# Patient Record
Sex: Female | Born: 1975 | Race: Black or African American | Hispanic: No | Marital: Married | Smoking: Never smoker
Health system: Southern US, Community
[De-identification: ages and names within clinical notes are randomized; demographics above are authoritative.]

## PROBLEM LIST (undated history)

## (undated) DIAGNOSIS — B009 Herpesviral infection, unspecified: Secondary | ICD-10-CM

## (undated) DIAGNOSIS — E7800 Pure hypercholesterolemia, unspecified: Secondary | ICD-10-CM

## (undated) DIAGNOSIS — D649 Anemia, unspecified: Secondary | ICD-10-CM

## (undated) DIAGNOSIS — I10 Essential (primary) hypertension: Secondary | ICD-10-CM

## (undated) DIAGNOSIS — J45909 Unspecified asthma, uncomplicated: Secondary | ICD-10-CM

## (undated) DIAGNOSIS — O039 Complete or unspecified spontaneous abortion without complication: Secondary | ICD-10-CM

## (undated) HISTORY — DX: Anemia, unspecified: D64.9

## (undated) HISTORY — DX: Essential (primary) hypertension: I10

## (undated) HISTORY — DX: Unspecified asthma, uncomplicated: J45.909

## (undated) HISTORY — DX: Herpesviral infection, unspecified: B00.9

## (undated) HISTORY — DX: Pure hypercholesterolemia, unspecified: E78.00

## (undated) HISTORY — DX: Complete or unspecified spontaneous abortion without complication: O03.9

## (undated) HISTORY — PX: ADENOIDECTOMY: SUR15

---

## 1999-01-28 HISTORY — PX: TONSILLECTOMY: SUR1361

## 2004-10-18 ENCOUNTER — Other Ambulatory Visit: Admission: RE | Admit: 2004-10-18 | Discharge: 2004-10-18 | Payer: Self-pay | Admitting: Obstetrics and Gynecology

## 2005-10-27 ENCOUNTER — Inpatient Hospital Stay (HOSPITAL_COMMUNITY): Admission: AD | Admit: 2005-10-27 | Discharge: 2005-10-27 | Payer: Self-pay | Admitting: Obstetrics and Gynecology

## 2006-01-06 ENCOUNTER — Inpatient Hospital Stay (HOSPITAL_COMMUNITY): Admission: AD | Admit: 2006-01-06 | Discharge: 2006-01-06 | Payer: Self-pay | Admitting: Obstetrics and Gynecology

## 2006-01-09 ENCOUNTER — Inpatient Hospital Stay (HOSPITAL_COMMUNITY): Admission: AD | Admit: 2006-01-09 | Discharge: 2006-01-09 | Payer: Self-pay | Admitting: Obstetrics and Gynecology

## 2006-01-10 ENCOUNTER — Inpatient Hospital Stay (HOSPITAL_COMMUNITY): Admission: AD | Admit: 2006-01-10 | Discharge: 2006-01-12 | Payer: Self-pay | Admitting: Obstetrics and Gynecology

## 2006-01-18 ENCOUNTER — Inpatient Hospital Stay (HOSPITAL_COMMUNITY): Admission: AD | Admit: 2006-01-18 | Discharge: 2006-01-18 | Payer: Self-pay | Admitting: Obstetrics and Gynecology

## 2008-11-07 ENCOUNTER — Emergency Department (HOSPITAL_COMMUNITY): Admission: EM | Admit: 2008-11-07 | Discharge: 2008-11-07 | Payer: Self-pay | Admitting: Emergency Medicine

## 2008-11-07 IMAGING — CR DG TIBIA/FIBULA 2V*R*
2 series · 2 of 2 positions shown · non-contrast
Comparison: None

CLINICAL DATA: Pain after fall

RIGHT TIBIA AND FIBULA - 2 VIEW

[t tib/fib ap right]
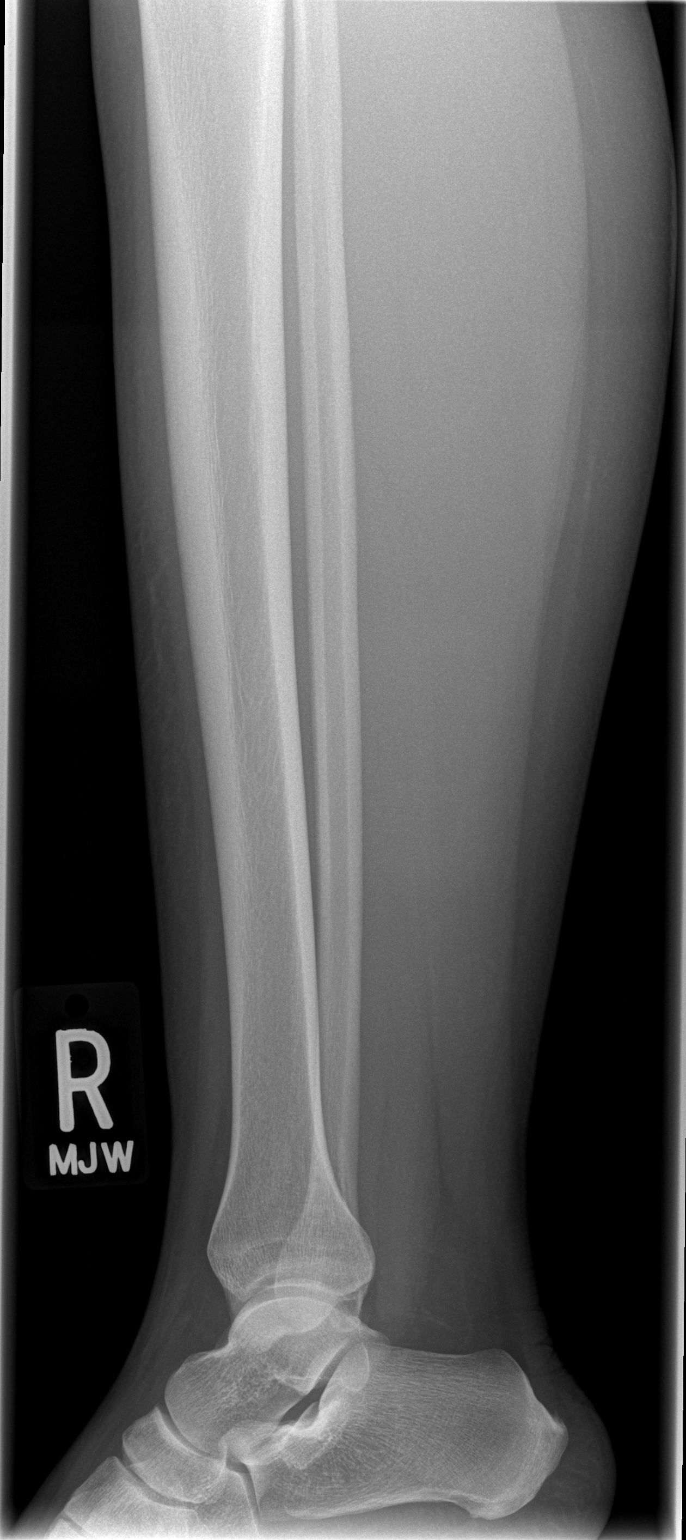

[t tib/fib lat right]
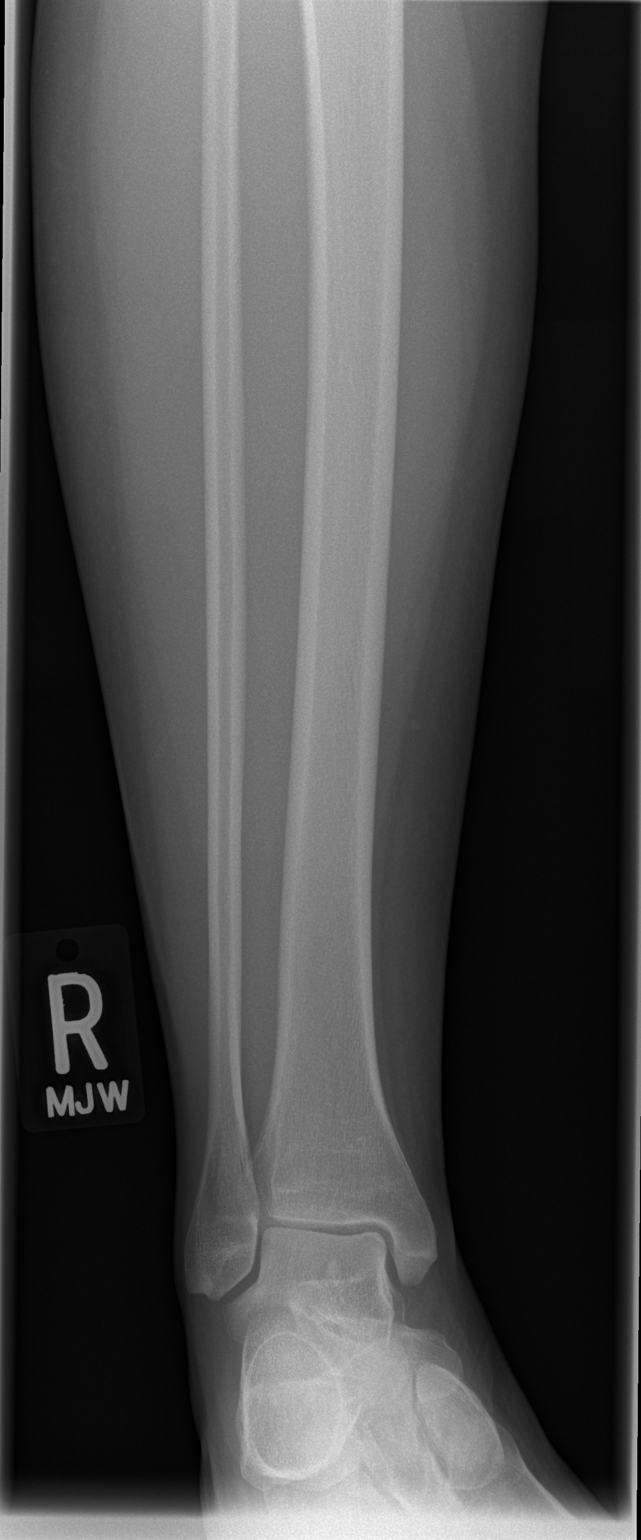

[2 of 2 positions shown; findings below may reference images not displayed]

FINDINGS: There is no evidence of bone, joint, or soft tissue
abnormality.
IMPRESSION: Negative right tibia and fibula.

## 2008-11-07 IMAGING — CR DG FINGER LITTLE 2+V*R*
3 series · 3 of 3 positions shown · non-contrast
Comparison: None

CLINICAL DATA: Pain after fall

RIGHT LITTLE FINGER 2+V

[x finger pa right]
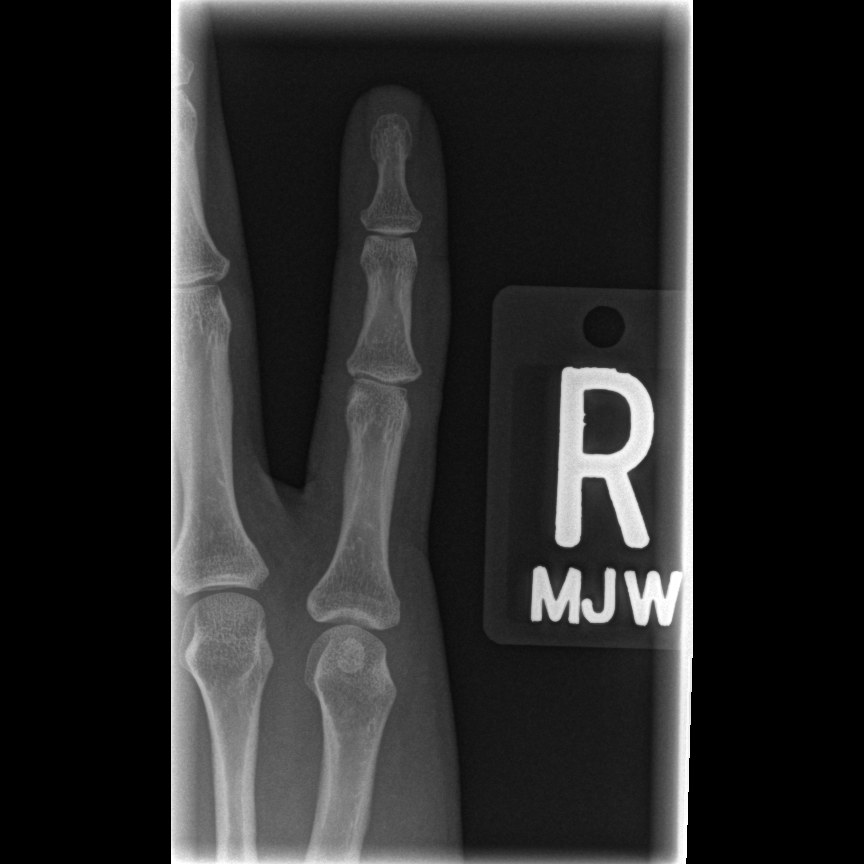

[x finger obl. right]
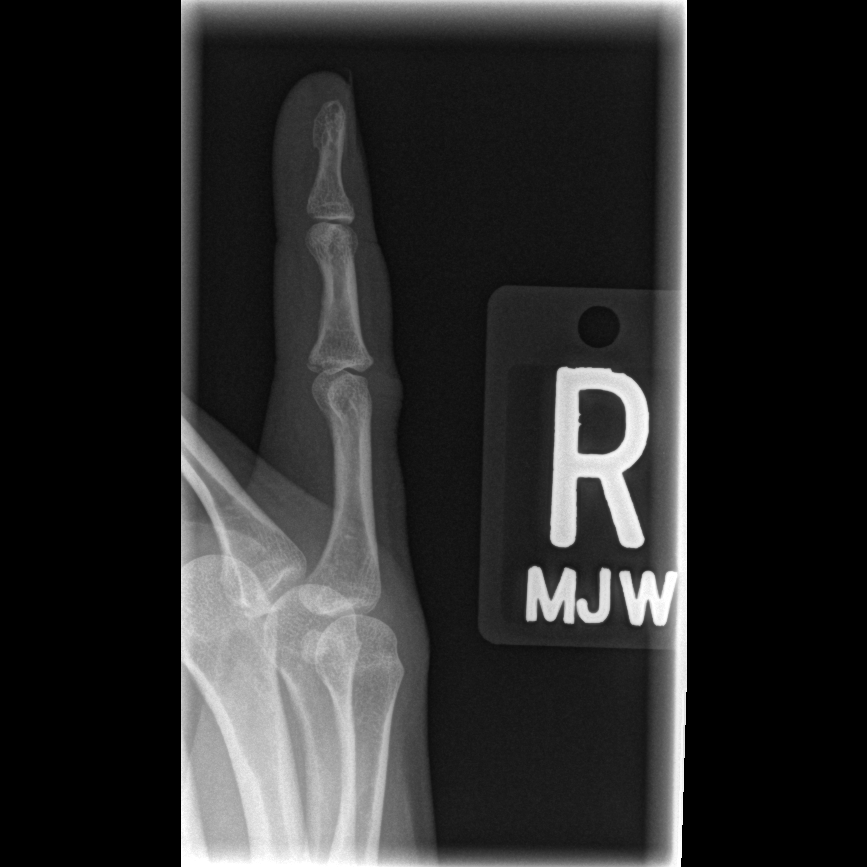

[x finger lateral right]
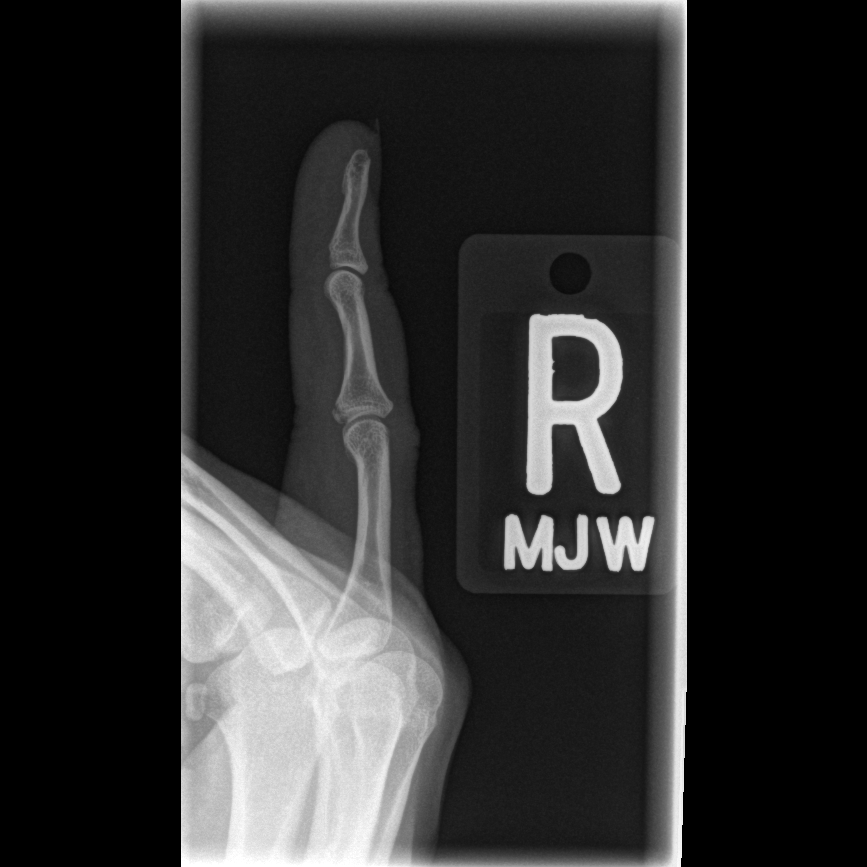

[3 of 3 positions shown; findings below may reference images not displayed]

FINDINGS: There is a minimally displaced fracture at the proximal,
palmar aspect of the fifth middle phalanx.  There is overlying soft
tissue swelling.  The joint compartment remains intact.
IMPRESSION: Right fifth middle phalanx fracture
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
## 2012-01-20 ENCOUNTER — Ambulatory Visit: Payer: Self-pay | Admitting: Obstetrics and Gynecology

## 2012-02-02 ENCOUNTER — Ambulatory Visit (INDEPENDENT_AMBULATORY_CARE_PROVIDER_SITE_OTHER): Payer: BC Managed Care – PPO | Admitting: Obstetrics and Gynecology

## 2012-02-02 ENCOUNTER — Encounter: Payer: Self-pay | Admitting: Obstetrics and Gynecology

## 2012-02-02 VITALS — BP 116/70 | HR 78 | Ht 66.5 in | Wt 187.0 lb

## 2012-02-02 DIAGNOSIS — Z8349 Family history of other endocrine, nutritional and metabolic diseases: Secondary | ICD-10-CM

## 2012-02-02 DIAGNOSIS — Z01419 Encounter for gynecological examination (general) (routine) without abnormal findings: Secondary | ICD-10-CM

## 2012-02-02 DIAGNOSIS — N644 Mastodynia: Secondary | ICD-10-CM

## 2012-02-02 DIAGNOSIS — O2620 Pregnancy care for patient with recurrent pregnancy loss, unspecified trimester: Secondary | ICD-10-CM

## 2012-02-02 DIAGNOSIS — Z124 Encounter for screening for malignant neoplasm of cervix: Secondary | ICD-10-CM

## 2012-02-02 NOTE — Progress Notes (Signed)
Subjective:    Beverly Hobbs is a 37 y.o. female, G3P1, who presents for an annual exam. The patient wants to donate eggs to her sister who is having difficulty getting pregnant.  Patient also wants to have another baby-had 2 miscarriages in 2011.  First miscarriage was after a few weeks and the second was at 7 weeks. Patient goes on to say that she has left breast pain that is dull and achy any random though not cyclical.  Denies nipple discharge, nodules or skin changes.  Menstrual cycle:   LMP: Patient's last menstrual period was 02/14/2011. Flow x 5 days with pad change every 3 hours with minimal cramping.             Review of Systems Pertinent items are noted in HPI. Denies pelvic pain, urinary tract symptoms, vaginitis symptoms, irregular bleeding, menopausal symptoms, change in bowel habits or rectal bleeding   Objective:    BP 116/70  Pulse 78  Ht 5' 6.5" (1.689 m)  Wt 187 lb (84.823 kg)  BMI 29.73 kg/m2  LMP 02/14/2011     Wt Readings from Last 1 Encounters:  02/02/12 187 lb (84.823 kg)   Body mass index is 29.73 kg/(m^2).  General Appearance: Alert, no acute distress HEENT: Grossly normal Neck / Thyroid: Supple, no thyromegaly or cervical adenopathy Lungs: Clear to auscultation bilaterally Back: No CVA tenderness Breast Exam: No masses or nodes.No dimpling, nipple retraction or discharge. Cardiovascular: Regular rate and rhythm.  Gastrointestinal: Soft, non-tender, no masses or organomegaly Pelvic Exam: EGBUS-wnl, vagina-normal rugae, cervix- without lesions or tenderness, uterus appears normal size shape and consistency, adnexae-no masses or tenderness Lymphatic Exam: Non-palpable nodes in neck, clavicular,  axillary, or inguinal regions  Skin: no rashes or abnormalities Extremities: no clubbing cyanosis or edema  Neurologic: grossly normal Psychiatric: Alert and oriented  Assessment:   Routine GYN Exam Left Breast Pain   Plan:    PAP sent  AMH-pending  (patient made aware that insurance may not cover)  Dx MG for left breast pain  RTO 1 year or prn  Zaria Taha,ELMIRAPA-C

## 2012-02-02 NOTE — Progress Notes (Signed)
Regular Periods: yes Mammogram: no  Monthly Breast Ex.: no Exercise: no  Tetanus < 10 years: no Seatbelts: yes  NI. Bladder Functn.: yes Abuse at home: no  Daily BM's: yes Stressful Work: yes  Healthy Diet: yes Sigmoid-Colonoscopy: NO  Calcium: no Medical problems this year: FERTILITY AND PT WANT BLOOD TEST; TO DONATE EGGS TO SISTER   LAST PAP:12/12  Contraception: NONE  Mammogram:  NO  PCP: DR. Pecola Leisure  PMH: NO CHANGE  FMH: NO CHANGE  Last Bone Scan: NO   PT IS MARRIED.

## 2012-02-03 LAB — PAP IG W/ RFLX HPV ASCU

## 2012-02-04 LAB — ANTI MULLERIAN HORMONE: AMH AssessR: 2.46 ng/mL

## 2012-02-06 ENCOUNTER — Ambulatory Visit
Admission: RE | Admit: 2012-02-06 | Discharge: 2012-02-06 | Disposition: A | Discharge status: Home / Self Care | Payer: Self-pay | Source: Ambulatory Visit | Attending: Obstetrics and Gynecology | Admitting: Obstetrics and Gynecology

## 2012-02-06 DIAGNOSIS — N644 Mastodynia: Secondary | ICD-10-CM

## 2012-02-09 ENCOUNTER — Telehealth: Payer: Self-pay | Admitting: Obstetrics and Gynecology

## 2012-02-09 NOTE — Telephone Encounter (Signed)
Call to advise patient of normal AMH = 2.46 which implies good ovarian reserve.  Patient acknowledged.  Aby Gessel, PA-C

## 2013-11-28 ENCOUNTER — Encounter: Payer: Self-pay | Admitting: Obstetrics and Gynecology

## 2015-05-26 LAB — GLUCOSE, POCT (MANUAL RESULT ENTRY): POC Glucose: 95 mg/dl (ref 70–99)

## 2015-05-26 LAB — POCT GLYCOSYLATED HEMOGLOBIN (HGB A1C): Hemoglobin A1C: 5.1

## 2016-03-17 ENCOUNTER — Emergency Department (HOSPITAL_COMMUNITY)
Admission: EM | Admit: 2016-03-17 | Discharge: 2016-03-18 | Disposition: A | Discharge status: Home / Self Care | Payer: 59 | Attending: Emergency Medicine | Admitting: Emergency Medicine

## 2016-03-17 ENCOUNTER — Encounter (HOSPITAL_COMMUNITY): Payer: Self-pay | Admitting: Emergency Medicine

## 2016-03-17 ENCOUNTER — Emergency Department (HOSPITAL_COMMUNITY): Payer: 59

## 2016-03-17 DIAGNOSIS — J45909 Unspecified asthma, uncomplicated: Secondary | ICD-10-CM | POA: Diagnosis not present

## 2016-03-17 DIAGNOSIS — R0789 Other chest pain: Secondary | ICD-10-CM | POA: Diagnosis present

## 2016-03-17 DIAGNOSIS — Z79899 Other long term (current) drug therapy: Secondary | ICD-10-CM | POA: Insufficient documentation

## 2016-03-17 DIAGNOSIS — I10 Essential (primary) hypertension: Secondary | ICD-10-CM | POA: Diagnosis not present

## 2016-03-17 LAB — COMPREHENSIVE METABOLIC PANEL
ALK PHOS: 61 U/L (ref 38–126)
ALT: 14 U/L (ref 14–54)
ANION GAP: 12 (ref 5–15)
AST: 18 U/L (ref 15–41)
Albumin: 4.3 g/dL (ref 3.5–5.0)
BILIRUBIN TOTAL: 0.5 mg/dL (ref 0.3–1.2)
BUN: 12 mg/dL (ref 6–20)
CALCIUM: 9.4 mg/dL (ref 8.9–10.3)
CO2: 26 mmol/L (ref 22–32)
Chloride: 98 mmol/L — ABNORMAL LOW (ref 101–111)
Creatinine, Ser: 0.75 mg/dL (ref 0.44–1.00)
GFR calc Af Amer: >60 mL/min (ref >60)
Glucose, Bld: 92 mg/dL (ref 65–99)
POTASSIUM: 4.1 mmol/L (ref 3.5–5.1)
Sodium: 136 mmol/L (ref 135–145)
TOTAL PROTEIN: 7.8 g/dL (ref 6.5–8.1)

## 2016-03-17 LAB — CBC WITH DIFFERENTIAL/PLATELET
Basophils Absolute: 0 10*3/uL (ref 0.0–0.1)
Basophils Relative: 0 %
Eosinophils Absolute: 0.2 10*3/uL (ref 0.0–0.7)
Eosinophils Relative: 2 %
HEMATOCRIT: 33.4 % — AB (ref 36.0–46.0)
Hemoglobin: 10.7 g/dL — ABNORMAL LOW (ref 12.0–15.0)
LYMPHS PCT: 26 %
Lymphs Abs: 2.3 10*3/uL (ref 0.7–4.0)
MCH: 25.2 pg — ABNORMAL LOW (ref 26.0–34.0)
MCHC: 32 g/dL (ref 30.0–36.0)
MCV: 78.8 fL (ref 78.0–100.0)
MONO ABS: 0.7 10*3/uL (ref 0.1–1.0)
MONOS PCT: 7 %
NEUTROS ABS: 5.8 10*3/uL (ref 1.7–7.7)
Neutrophils Relative %: 65 %
Platelets: 268 10*3/uL (ref 150–400)
RBC: 4.24 MIL/uL (ref 3.87–5.11)
RDW: 15.6 % — AB (ref 11.5–15.5)
WBC: 8.9 10*3/uL (ref 4.0–10.5)

## 2016-03-17 LAB — I-STAT TROPONIN, ED: TROPONIN I, POC: 0 ng/mL (ref 0.00–0.08)

## 2016-03-17 IMAGING — DX DG CHEST 2V
2 series · 2 of 2 positions shown · non-contrast
Comparison: None.

CLINICAL DATA: Acute onset of throat and midsternal chest pain.
Pain radiates to the left jaw, ear and left side of the neck.
Initial encounter.

EXAM:
CHEST  2 VIEW

[chest pa]
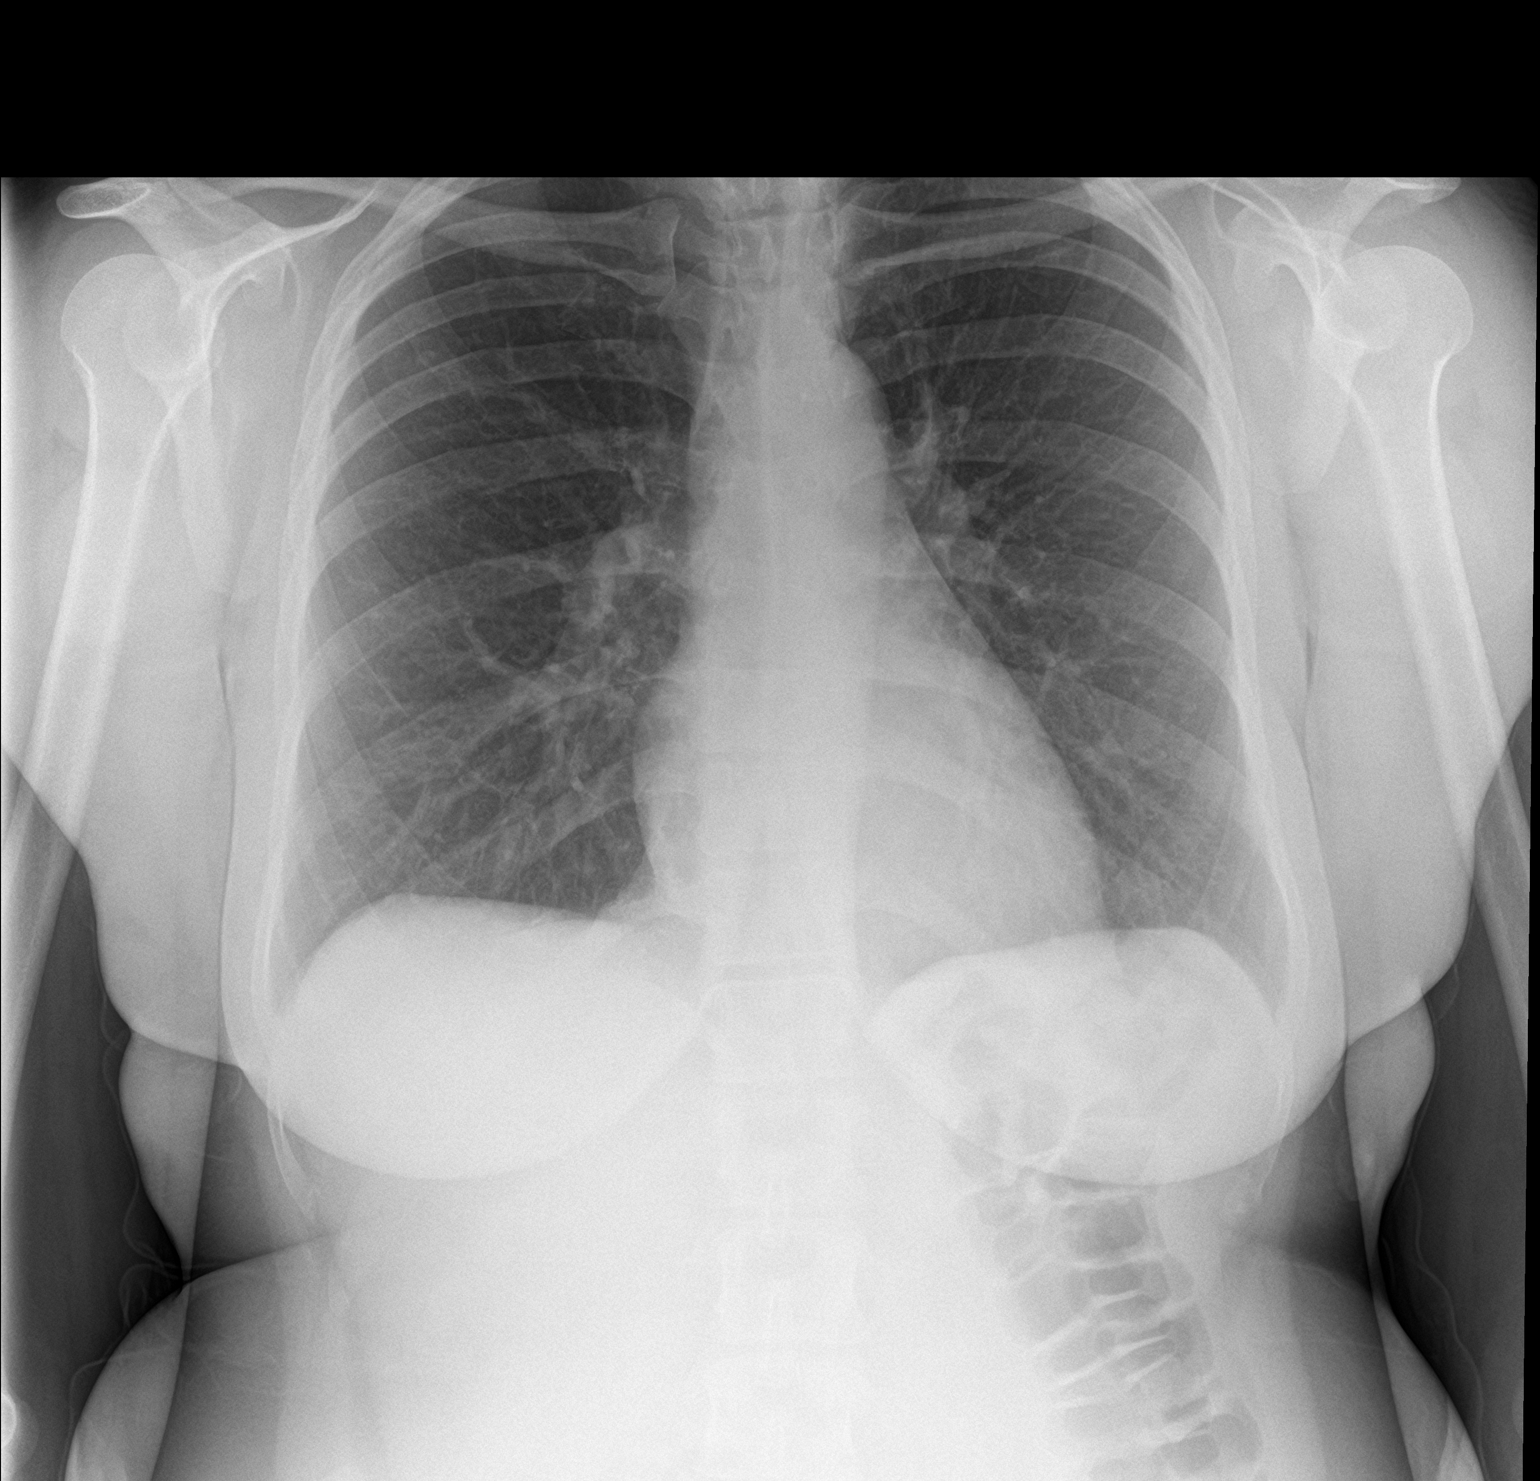

[chest lat]
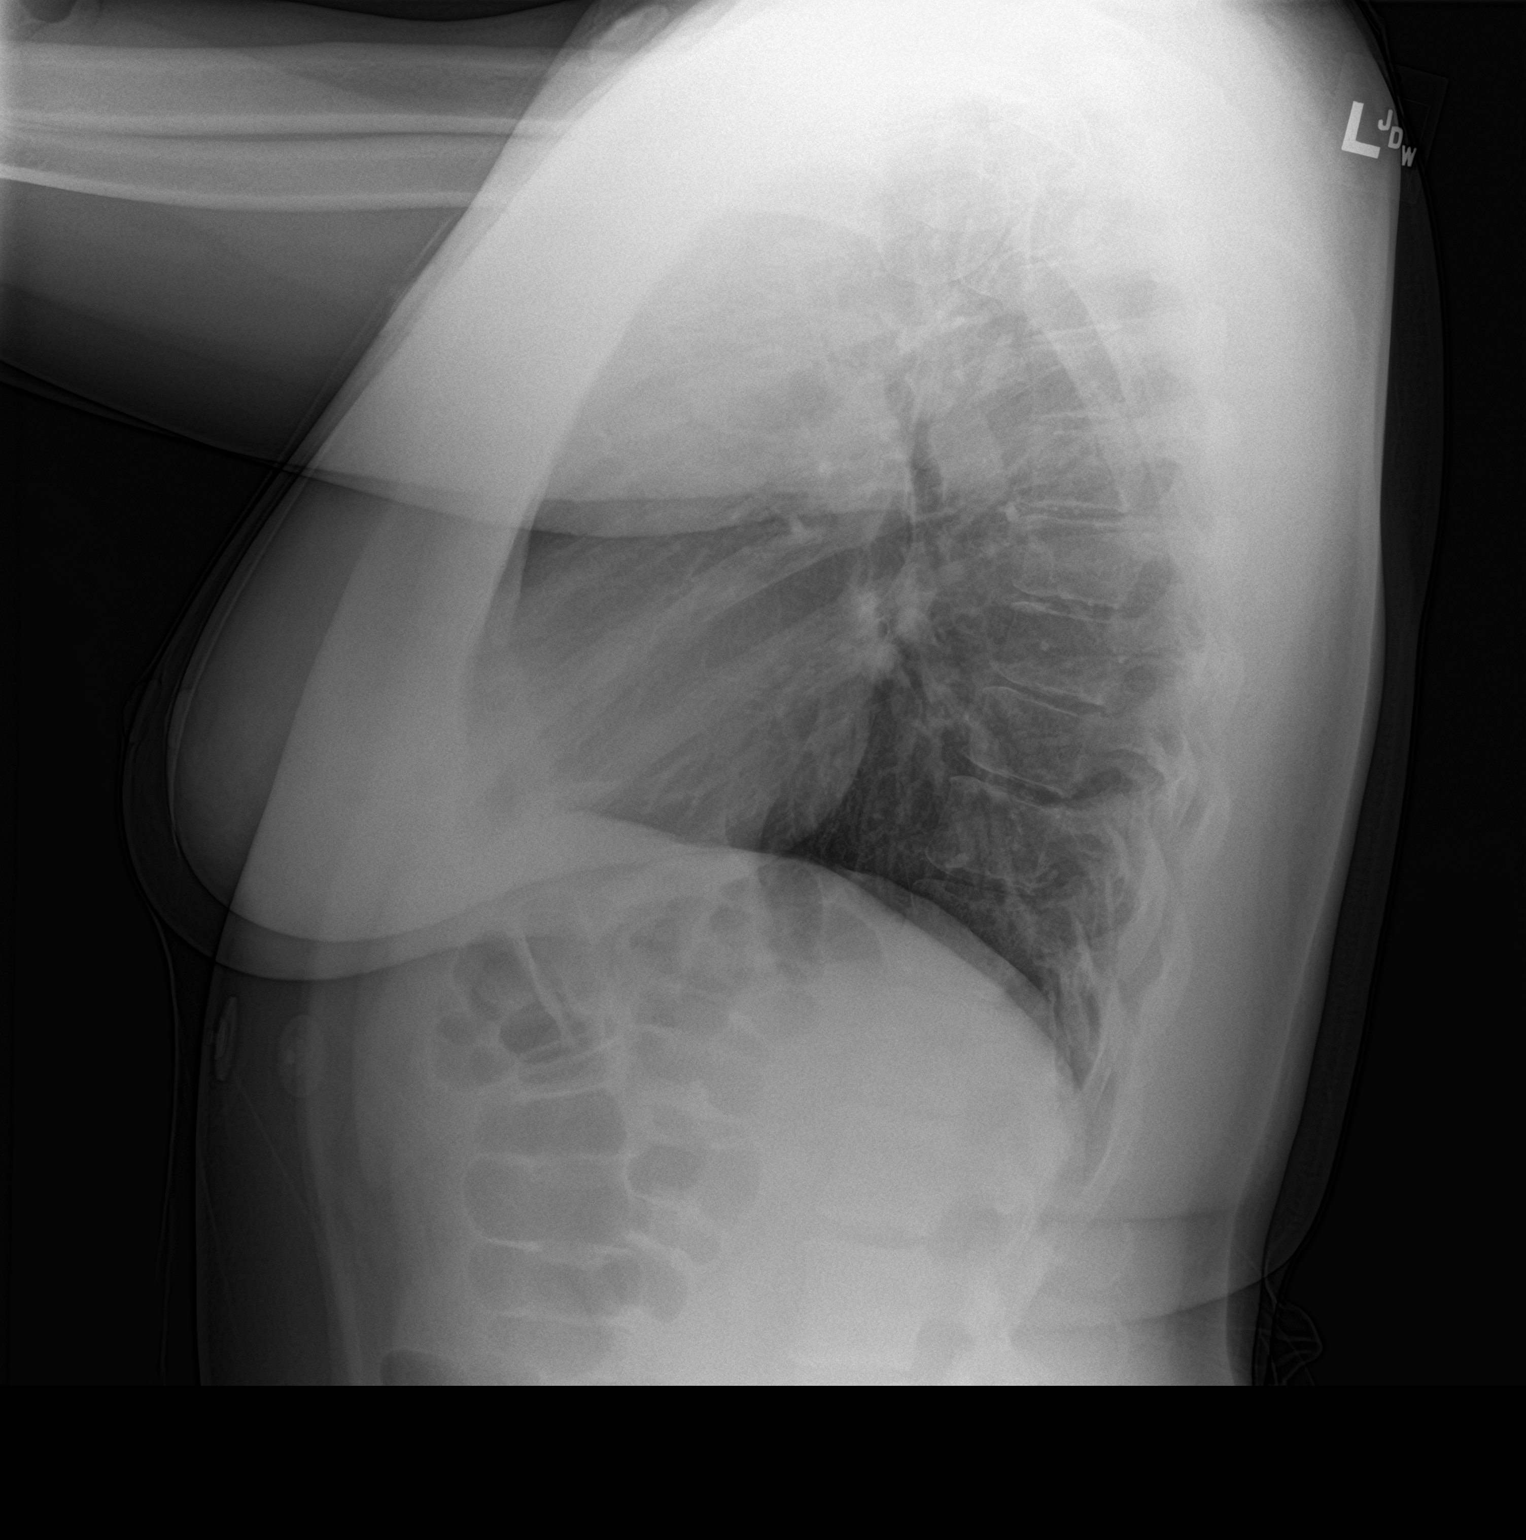

[2 of 2 positions shown; findings below may reference images not displayed]

FINDINGS: The lungs are well-aerated and clear. There is no evidence of focal
opacification, pleural effusion or pneumothorax.

The heart is normal in size; the mediastinal contour is within
normal limits. No acute osseous abnormalities are seen.
IMPRESSION: No acute cardiopulmonary process seen.

## 2016-03-17 MED ORDER — LABETALOL HCL 200 MG PO TABS
100.0000 mg | ORAL_TABLET | Freq: Once | ORAL | Status: AC
  Administered 2016-03-17: 100 mg via ORAL
  Filled 2016-03-17: qty 1

## 2016-03-17 NOTE — ED Provider Notes (Signed)
[facility]-EMERGENCY DEPT Provider Note   CSN: [id number] Arrival date & time: 03/17/16  2007  By signing my name below, I, [name], attest that this documentation has been prepared under the direction and in the presence of Tomasita Crumble, MD . Electronically Signed: [name], Scribe. 03/17/2016. 11:36 PM.   History   Chief Complaint Chief Complaint  Patient presents with  . Chest Pain    The history is provided by the patient. No language interpreter was used.    HPI Comments:  Beverly Hobbs is a 41 y.o. female with a history of HTN who presents to the Emergency Department complaining of HA which began ~1830. She notes the pain radiated to left neck area and shortly after began to experience pressure sensation in throat and chest. Pt states she checked her BP at that time and it was 169/98. She misplaced her Labetalol a few days ago. She takes 100mg  BID and states BP is usually well controlled 110/70s. Pt reports associated intermittent episodes of nausea. No SOB, diaphoresis, or vomiting. No recent surgery, hospitalization or long periods of immobilization. No lower extremity swelling or pain. No alleviating factors noted.   Past Medical History:  Diagnosis Date  . Anemia    H/O  . Asthma    AS A CHILD  . HSV-1 infection    H/O  . Hypertension   . SAB (spontaneous abortion)    H/O    There are no active problems to display for this patient.   Past Surgical History:  Procedure Laterality Date  . ADENOIDECTOMY    . TONSILLECTOMY      OB History    Gravida Para Term Preterm AB Living   3 1       1    SAB TAB Ectopic Multiple Live Births                   Home Medications    Prior to Admission medications   Medication Sig Start Date End Date Taking? Authorizing Provider  labetalol (NORMODYNE) 100 MG tablet Take 100 mg by mouth 2 (two) times daily.    Historical Provider, MD  Lansoprazole (PREVACID PO) Take by mouth.    Historical Provider, MD    Family  History Family History  Problem Relation Age of Onset  . Hypertension Mother   . Bipolar disorder Mother   . Heart disease Father   . Hypertension Father   . CVA Father   . Hypertension Sister   . Heart disease Sister   . Fibroids Sister   . Heart attack Maternal Grandmother   . Heart disease Maternal Grandmother   . Hypertension Paternal Grandmother   . Heart disease Paternal Grandmother   . Thyroid disease Paternal Grandmother   . Heart disease Paternal Grandfather   . Heart attack Paternal Grandfather     Social History Social History  Substance Use Topics  . Smoking status: Never Smoker  . Smokeless tobacco: Never Used  . Alcohol use No     Allergies   Codeine   Review of Systems Review of Systems 10 systems reviewed and all are negative for acute change except as noted in the HPI.    Physical Exam Updated Vital Signs BP 173/100 (BP Location: Left Arm)   Pulse 78   Temp 98.5 F (36.9 C) (Oral)   Resp 16   Ht 5\' 7"  (1.702 m)   Wt 207 lb (93.9 kg)   LMP 03/15/2016 (Exact Date)   SpO2 96%  BMI 32.42 kg/m   Physical Exam  Constitutional: She is oriented to person, place, and time. She appears well-developed and well-nourished. No distress.  HENT:  Head: Normocephalic and atraumatic.  Nose: Nose normal.  Mouth/Throat: Oropharynx is clear and moist. No oropharyngeal exudate.  Eyes: Conjunctivae and EOM are normal. Pupils are equal, round, and reactive to light. No scleral icterus.  Neck: Normal range of motion. Neck supple. No JVD present. No tracheal deviation present. No thyromegaly present.  Cardiovascular: Normal rate, regular rhythm and normal heart sounds.  Exam reveals no gallop and no friction rub.   No murmur heard. Pulmonary/Chest: Effort normal and breath sounds normal. No respiratory distress. She has no wheezes. She exhibits no tenderness.  Abdominal: Soft. Bowel sounds are normal. She exhibits no distension and no mass. There is no  tenderness. There is no rebound and no guarding.  Musculoskeletal: Normal range of motion. She exhibits no edema or tenderness.  Lymphadenopathy:    She has no cervical adenopathy.  Neurological: She is alert and oriented to person, place, and time. No cranial nerve deficit. She exhibits normal muscle tone.  Skin: Skin is warm and dry. No rash noted. No erythema. No pallor.  Nursing note and vitals reviewed.    ED Treatments / Results  DIAGNOSTIC STUDIES:  Oxygen Saturation is 100% on room air, normal by my interpretation.    COORDINATION OF CARE:  11:30 PM Discussed treatment plan with pt at bedside and pt agreed to plan.  Labs (all labs ordered are listed, but only abnormal results are displayed) Labs Reviewed  CBC WITH DIFFERENTIAL/PLATELET - Abnormal; Notable for the following:       Result Value   Hemoglobin 10.7 (*)    HCT 33.4 (*)    MCH 25.2 (*)    RDW 15.6 (*)    All other components within normal limits  COMPREHENSIVE METABOLIC PANEL - Abnormal; Notable for the following:    Chloride 98 (*)    All other components within normal limits  I-STAT TROPOININ, ED    EKG  EKG Interpretation  Date/Time:  Monday March 17 2016 21:09:48 EST Ventricular Rate:  82 PR Interval:  166 QRS Duration: 76 QT Interval:  360 QTC Calculation: 420 R Axis:   60 Text Interpretation:  Normal sinus rhythm Normal ECG No old tracing to compare Confirmed by Erroll LunaOni, Nobel Brar Ayokunle [phone]([id number]) on 03/17/2016 11:07:51 PM       Radiology Dg Chest 2 View  Result Date: 03/17/2016 CLINICAL DATA:  Acute onset of throat and midsternal chest pain. Pain radiates to the left jaw, ear and left side of the neck. Initial encounter. EXAM: CHEST  2 VIEW COMPARISON:  None. FINDINGS: The lungs are well-aerated and clear. There is no evidence of focal opacification, pleural effusion or pneumothorax. The heart is normal in size; the mediastinal contour is within normal limits. No acute osseous abnormalities  are seen. IMPRESSION: No acute cardiopulmonary process seen. Electronically Signed   By: Roanna RaiderJeffery  Chang M.D.   On: 03/17/2016 22:42    Procedures Procedures (including critical care time)  Medications Ordered in ED Medications - No data to display   Initial Impression / Assessment and Plan / ED Course  I have reviewed the triage vital signs and the nursing notes.  Pertinent labs & imaging results that were available during my care of the patient were reviewed by me and considered in my medical decision making (see chart for details).      Patient presents to the  ED for CP.  Her history is low risk for ACS.  PERC negative.  HEART score is 2 for history and HTN.  Initial trop and EKG is negative.  She was given her home medication and was held for 3 hour trop and EKG.  12:46 AM repeat troponin and EKG are again normal. Likely not ACS.  Plan to fu with PCP within 3 days. She appears well and in NAD. VS are within her normal limits, she remains asymptomatic from her HTN.  She is safe for DC.   Final Clinical Impressions(s) / ED Diagnoses   Final diagnoses:  None    New Prescriptions New Prescriptions   No medications on file     I personally performed the services described in this documentation, which was scribed in my presence. The recorded information has been reviewed and is accurate.       Tomasita Crumble, MD 03/18/16 [phone]

## 2016-03-17 NOTE — ED Triage Notes (Signed)
Pt to ED with c/0 pain in left neck and mid chest.  Pt st's she thought it was indigestion and took pepto bismol without relief.  Pt also c/o feeling nauseated

## 2016-03-18 LAB — I-STAT TROPONIN, ED: Troponin i, poc: 0 ng/mL (ref 0.00–0.08)

## 2016-03-18 MED ORDER — LABETALOL HCL 100 MG PO TABS: 100.0000 mg | ORAL_TABLET | Freq: Two times a day (BID) | ORAL | 0 refills | Status: AC

## 2017-05-28 ENCOUNTER — Emergency Department (HOSPITAL_COMMUNITY): Payer: 59

## 2017-05-28 ENCOUNTER — Emergency Department (HOSPITAL_COMMUNITY)
Admission: EM | Admit: 2017-05-28 | Discharge: 2017-05-29 | Disposition: A | Discharge status: Home / Self Care | Payer: 59 | Attending: Emergency Medicine | Admitting: Emergency Medicine

## 2017-05-28 ENCOUNTER — Other Ambulatory Visit: Payer: Self-pay

## 2017-05-28 DIAGNOSIS — Z79899 Other long term (current) drug therapy: Secondary | ICD-10-CM | POA: Insufficient documentation

## 2017-05-28 DIAGNOSIS — I10 Essential (primary) hypertension: Secondary | ICD-10-CM | POA: Insufficient documentation

## 2017-05-28 DIAGNOSIS — R51 Headache: Secondary | ICD-10-CM | POA: Diagnosis not present

## 2017-05-28 DIAGNOSIS — R519 Headache, unspecified: Secondary | ICD-10-CM

## 2017-05-28 LAB — CBC
HEMATOCRIT: 32.9 % — AB (ref 36.0–46.0)
HEMOGLOBIN: 10.7 g/dL — AB (ref 12.0–15.0)
MCH: 26.8 pg (ref 26.0–34.0)
MCHC: 32.5 g/dL (ref 30.0–36.0)
MCV: 82.5 fL (ref 78.0–100.0)
Platelets: 348 10*3/uL (ref 150–400)
RBC: 3.99 MIL/uL (ref 3.87–5.11)
RDW: 13.5 % (ref 11.5–15.5)
WBC: 7.7 10*3/uL (ref 4.0–10.5)

## 2017-05-28 LAB — BASIC METABOLIC PANEL
Anion gap: 11 (ref 5–15)
BUN: 10 mg/dL (ref 6–20)
CO2: 26 mmol/L (ref 22–32)
Calcium: 9.9 mg/dL (ref 8.9–10.3)
Chloride: 102 mmol/L (ref 101–111)
Creatinine, Ser: 0.73 mg/dL (ref 0.44–1.00)
GFR calc Af Amer: >60 mL/min (ref >60)
GFR calc non Af Amer: >60 mL/min (ref >60)
GLUCOSE: 98 mg/dL (ref 65–99)
POTASSIUM: 4.2 mmol/L (ref 3.5–5.1)
Sodium: 139 mmol/L (ref 135–145)

## 2017-05-28 LAB — I-STAT BETA HCG BLOOD, ED (MC, WL, AP ONLY): I-stat hCG, quantitative: <5 m[IU]/mL (ref <5)

## 2017-05-28 LAB — I-STAT TROPONIN, ED: Troponin i, poc: 0 ng/mL (ref 0.00–0.08)

## 2017-05-28 IMAGING — CR DG CHEST 2V
2 series · 2 of 2 positions shown · non-contrast
Comparison: [DATE]

CLINICAL DATA: Hypertension with left-sided neck pain radiating to
the arm

EXAM:
CHEST - 2 VIEW

[chest pa]
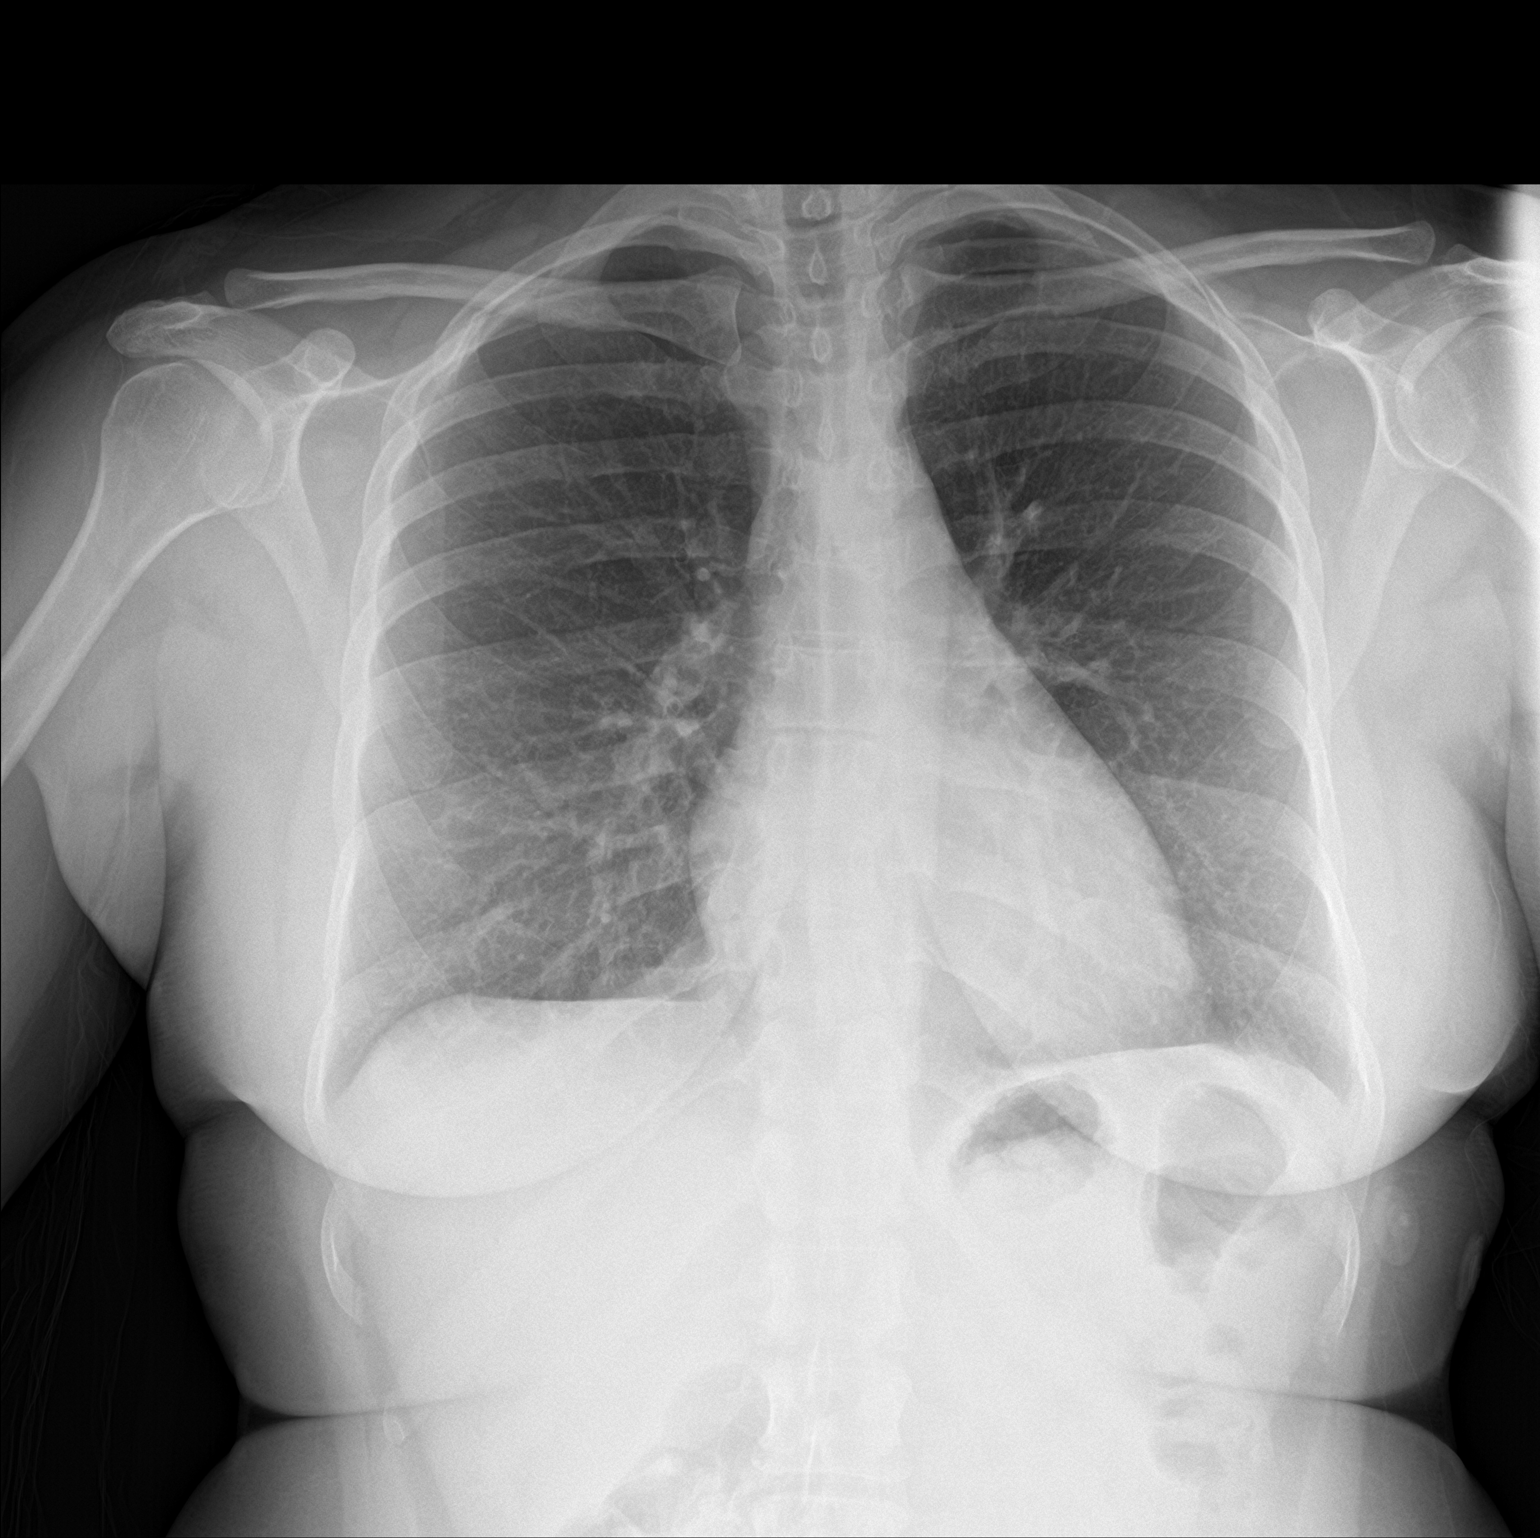

[chest lat]
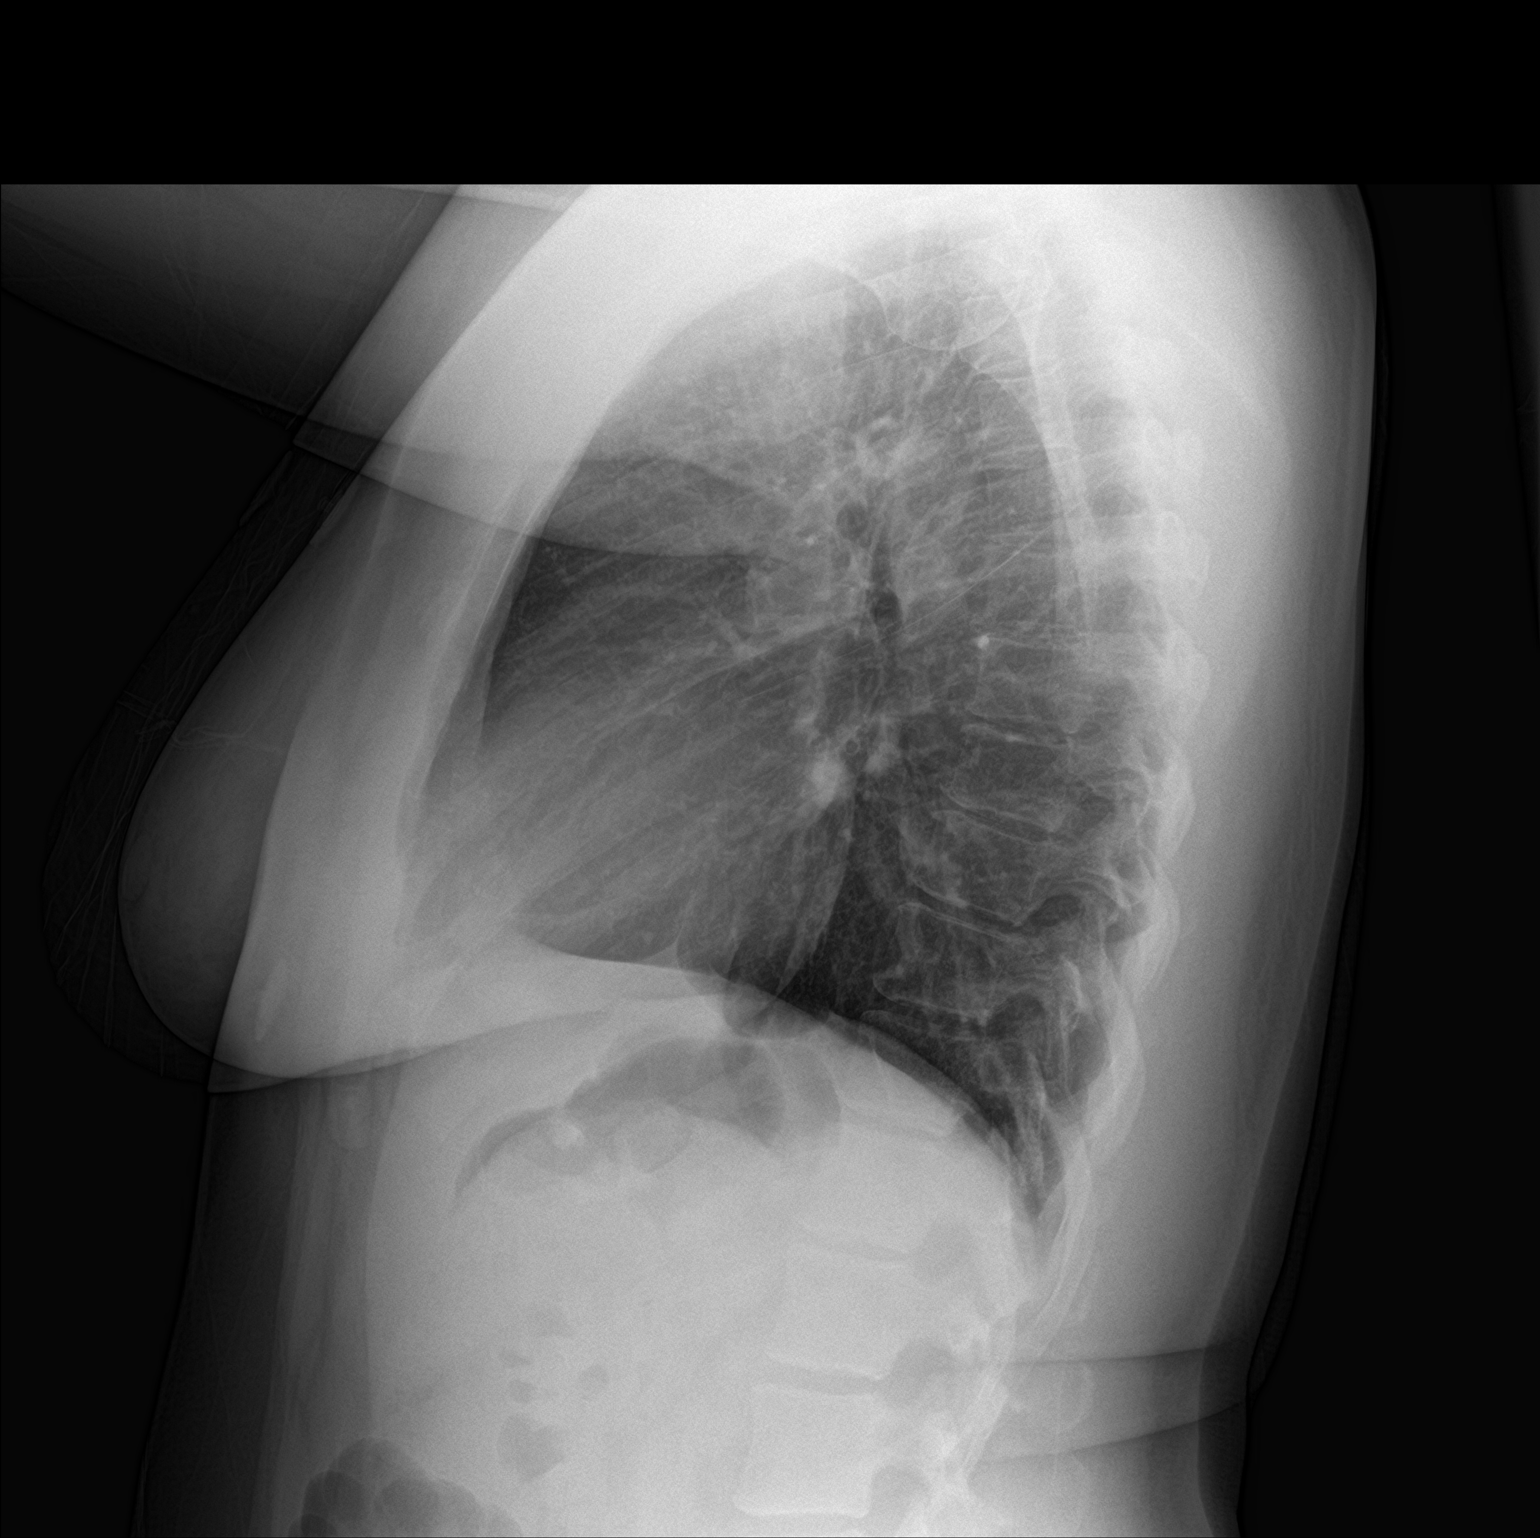

[2 of 2 positions shown; findings below may reference images not displayed]

FINDINGS: The heart size and mediastinal contours are within normal limits.
Both lungs are clear. The visualized skeletal structures are
unremarkable.
IMPRESSION: No active cardiopulmonary disease.

## 2017-05-28 NOTE — ED Triage Notes (Signed)
Patient is in tears; states the her BP has been elevated for the last few days and when it was elevated today the left side of her neck hurt and her left arm also causing numbness. Has taken  2 clonidine, 2 labetalol 1 amlodipine and 1 spiralactone. today

## 2017-05-29 ENCOUNTER — Emergency Department (HOSPITAL_COMMUNITY): Payer: 59

## 2017-05-29 IMAGING — CT CT ANGIO NECK
1 of 12 series · 5 of 35 positions shown · IV contrast (OMNI 350)
Comparison: None.

CLINICAL DATA: Sudden severe headache

EXAM:
CT ANGIOGRAPHY HEAD AND NECK
TECHNIQUE: Multidetector CT imaging of the head and neck was performed using
the standard protocol during bolus administration of intravenous
contrast. Multiplanar CT image reconstructions and MIPs were
obtained to evaluate the vascular anatomy. Carotid stenosis
measurements (when applicable) are obtained utilizing NASCET
criteria, using the distal internal carotid diameter as the
denominator.
CONTRAST:  50mL [L2] IOPAMIDOL ([L2]) INJECTION 76%

[Series 11: cta neck axial · axial · 0.34mm/px · z∈[-335,-97]mm · 5 of 358 slices shown]
[im 60/358  soft-tissue]
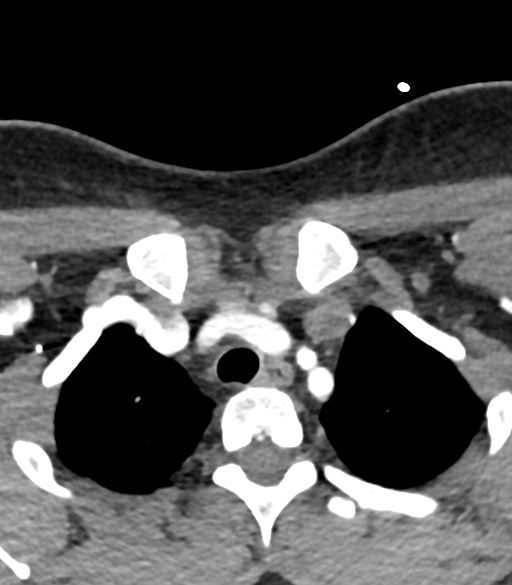
[im 120/358  bone]
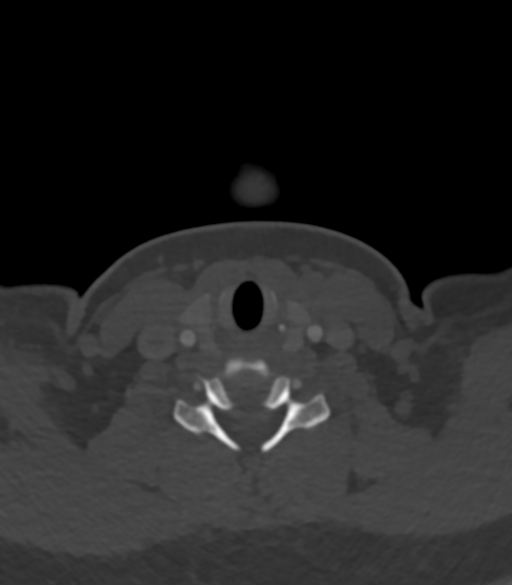
[im 179/358  soft-tissue]
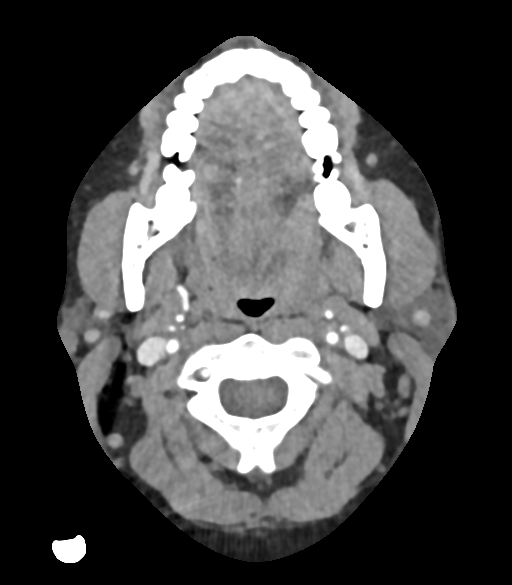
[im 239/358  bone]
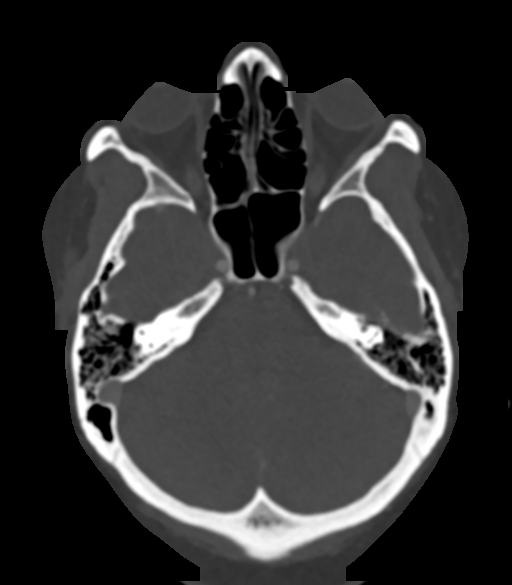
[im 298/358  soft-tissue]
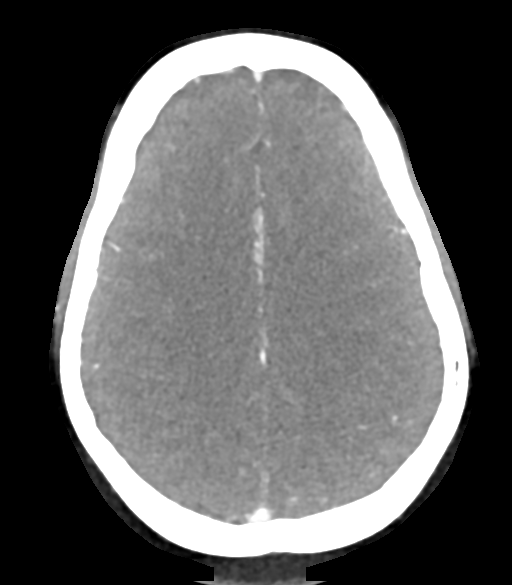

[5 of 35 positions shown; findings below may reference images not displayed]

FINDINGS: CT HEAD FINDINGS

Brain: No evidence of acute infarction, hemorrhage, hydrocephalus,
extra-axial collection or mass lesion/mass effect.

Vascular: See below

Skull: Normal. Negative for fracture or focal lesion.

Sinuses: Imaged portions are clear.

Orbits: Negative

Review of the MIP images confirms the above findings

CTA NECK FINDINGS

Aortic arch: Normal.  Three vessel branching pattern.

Right carotid system: Mild beading of the right mid ICA. No evidence
of dissection. No atheromatous stenosis.

Left carotid system: Subtle beading of the mid ICA. No atheromatous
change, stenosis, or dissection.

Vertebral arteries: Normal proximal subclavian arteries. The
vertebral arteries are smooth and diffusely patent

Skeleton: Mild reversal of cervical lordosis.  No acute finding

Other neck: Negative

Upper chest: Residual thymus which has normal thickness. No acute
finding

Review of the MIP images confirms the above findings

CTA HEAD FINDINGS

Anterior circulation: No atheromatous changes, stenosis, branch
occlusion, or aneurysm. No noted vascular malformation.

Posterior circulation: Codominant vertebral arteries. Dominant left
AICA and right PICA. No branch occlusion, beading, stenosis, or
aneurysm.

Venous sinuses: Patent

Anatomic variants: None

Delayed phase: No abnormal intracranial enhancement.

Review of the MIP images confirms the above findings
IMPRESSION: 1. No acute finding. No evidence of subarachnoid hemorrhage or
aneurysm.
2. Suspect mild fibromuscular dysplasia of the cervical carotids. No
indication of dissection.

## 2017-05-29 MED ORDER — IOPAMIDOL (ISOVUE-370) INJECTION 76%
INTRAVENOUS | Status: AC
  Filled 2017-05-29: qty 50

## 2017-05-29 MED ORDER — IOPAMIDOL (ISOVUE-370) INJECTION 76%
50.0000 mL | Freq: Once | INTRAVENOUS | Status: AC | PRN
  Administered 2017-05-29: 50 mL via INTRAVENOUS

## 2017-05-29 NOTE — Discharge Instructions (Signed)
Continue taking your amlodipine and labetalol as prescribed.  Drink plenty of water and get plenty of rest.  You may take Tylenol every 6 hours as needed for headaches.  Do not exceed 4000 mg of Tylenol daily.  Follow-up with your primary care physician and eventually cardiology for reevaluation of your high blood pressure.  Return to the emergency department if any concerning signs or symptoms develop such as fevers, sudden onset severe headache, chest pain, shortness of breath.

## 2017-05-29 NOTE — ED Provider Notes (Signed)
[facility] EMERGENCY DEPARTMENT Provider Note   CSN: [id number] Arrival date & time: 05/28/17  2209     History   Chief Complaint Chief Complaint  Patient presents with  . Hypertension    HPI Beverly Hobbs is a 42 y.o. female with history of anemia, asthma, hypertension presents for evaluation of acute onset, progressively worsening hypertension and intermittent headaches for 3 days.  She states that last week she was at the beach and sustained a sunburn and was taking ibuprofen for her pain.  She states "I was not thinking about how it would interact with my blood pressure medication".  She states that she takes her labetalol daily and her blood pressure usually runs around 120/80.  She states that this week her systolic has been upwards of 170-200s.  She did follow-up with her primary care physician who has since started her on clonidine, spironolactone, and amlodipine.  She states that when she checked her blood pressure last night before her medications it was 140/92 but then was more elevated after she took her labetalol.  She states she has been experiencing intermittent gradual onset of occipital headaches which are throbbing in nature.  She also notes associated photophobia but denies vision changes.  Last night at around 8 PM she experienced radiation of the pain to the left side of her neck as well as numbness of her left upper extremity which lasted for approximately 2 to 3 hours.  Her occipital headache remains but the remainder of her symptoms have resolved.  She denies chest pain, shortness of breath, vomiting, or abdominal pain. She did have nausea with headache earlier this week but this has resolved.  Her headaches improved with Tylenol.  She is a non-smoker.  Her father died of a stroke in his early 51s.   The history is provided by the patient.    Past Medical History:  Diagnosis Date  . Anemia    H/O  . Asthma    AS A CHILD  . HSV-1 infection    H/O  . Hypertension   . SAB (spontaneous abortion)    H/O    There are no active problems to display for this patient.   Past Surgical History:  Procedure Laterality Date  . ADENOIDECTOMY    . TONSILLECTOMY       OB History    Gravida  3   Para  1   Term      Preterm      AB      Living  1     SAB      TAB      Ectopic      Multiple      Live Births               Home Medications    Prior to Admission medications   Medication Sig Start Date End Date Taking? Authorizing Provider  amLODipine (NORVASC) 10 MG tablet Take 10 mg by mouth daily. 05/28/17  Yes [provider]  cloNIDine (CATAPRES) 0.2 MG tablet Take 0.2 mg by mouth 2 (two) times daily. 05/26/17  Yes [provider]  labetalol (NORMODYNE) 100 MG tablet Take 1 tablet (100 mg total) by mouth 2 (two) times daily. 03/18/16  Yes Tomasita Crumble, MD  spironolactone (ALDACTONE) 25 MG tablet Take 25 mg by mouth 2 (two) times daily.   Yes [provider]    Family History Family History  Problem Relation Age of Onset  .  Hypertension Mother   . Bipolar disorder Mother   . Heart disease Father   . Hypertension Father   . CVA Father   . Hypertension Sister   . Heart disease Sister   . Fibroids Sister   . Heart attack Maternal Grandmother   . Heart disease Maternal Grandmother   . Hypertension Paternal Grandmother   . Heart disease Paternal Grandmother   . Thyroid disease Paternal Grandmother   . Heart disease Paternal Grandfather   . Heart attack Paternal Grandfather     Social History Social History   Tobacco Use  . Smoking status: Never Smoker  . Smokeless tobacco: Never Used  Substance Use Topics  . Alcohol use: No  . Drug use: No     Allergies   Codeine and Hydrocodone-acetaminophen   Review of Systems Review of Systems  Constitutional: Negative for chills and fever.  Eyes: Positive for photophobia. Negative for visual disturbance.  Respiratory:  Negative for shortness of breath.   Cardiovascular: Negative for chest pain.  Gastrointestinal: Positive for nausea. Negative for abdominal pain and vomiting.  Musculoskeletal: Positive for neck pain.  Neurological: Positive for numbness (resolved) and headaches.  All other systems reviewed and are negative.    Physical Exam Updated Vital Signs BP 125/76   Pulse 62   Temp 99.1 F (37.3 C) (Oral)   Resp 12   Ht  (1.702 m)   Wt 96.2 kg (212 lb)   LMP 05/27/2017   SpO2 100%   BMI 33.20 kg/m   Physical Exam  Constitutional: She is oriented to person, place, and time. She appears well-developed and well-nourished. No distress.  HENT:  Head: Normocephalic and atraumatic.  Eyes: Conjunctivae are normal. Right eye exhibits no discharge. Left eye exhibits no discharge.  Neck: Normal range of motion. Neck supple. No JVD present. No tracheal deviation present.  No midline cervical spine tenderness, bilateral paracervical muscle tenderness and spasm appreciated with no deformity, crepitus, or step-off noted.  Cardiovascular: Normal rate, regular rhythm, normal heart sounds and intact distal pulses.  2+ radial and DP/PT pulses bl, negative Homan's bl, no LE edema  Pulmonary/Chest: Effort normal and breath sounds normal. No stridor. No respiratory distress. She has no wheezes. She has no rales. She exhibits no tenderness.  Abdominal: She exhibits no distension.  Musculoskeletal: She exhibits no edema.  Neurological: She is alert and oriented to person, place, and time. No cranial nerve deficit or sensory deficit. She exhibits normal muscle tone.  Mental Status:  Alert, thought content appropriate, able to give a coherent history. Speech fluent without evidence of aphasia. Able to follow 2 step commands without difficulty.  Cranial Nerves:  II:  Peripheral visual fields grossly normal, pupils equal, round, reactive to light III,IV, VI: ptosis not present, extra-ocular motions intact  bilaterally  V,VII: smile symmetric, facial light touch sensation equal VIII: hearing grossly normal to voice  X: uvula elevates symmetrically  XI: bilateral shoulder shrug symmetric and strong XII: midline tongue extension without fassiculations Motor:  Normal tone. 5/5 strength of BUE and BLE major muscle groups including strong and equal grip strength and dorsiflexion/plantar flexion Sensory: light touch normal in all extremities. Cerebellar: normal finger-to-nose with bilateral upper extremities Gait: normal gait and balance. Able to walk on toes and heels with ease.  CV: 2+ radial and DP/PT pulses  Romberg sign absent, no pronator drift, no nystagmus   Skin: Skin is warm and dry. No erythema.  Psychiatric: She has a normal mood and affect.  Her behavior is normal.  Nursing note and vitals reviewed.    ED Treatments / Results  Labs (all labs ordered are listed, but only abnormal results are displayed) Labs Reviewed  CBC - Abnormal; Notable for the following components:      Result Value   Hemoglobin 10.7 (*)    HCT 32.9 (*)    All other components within normal limits  BASIC METABOLIC PANEL  I-STAT TROPONIN, ED  I-STAT BETA HCG BLOOD, ED ([facility], [facility], [facility] ONLY)    EKG EKG Interpretation  Date/Time:  Thursday May 28 2017 22:16:17 EDT Ventricular Rate:  77 PR Interval:  178 QRS Duration: 80 QT Interval:  352 QTC Calculation: 398 R Axis:   66 Text Interpretation:  Normal sinus rhythm Normal ECG No significant change since last tracing Confirmed by Zadie Rhine ([id number]) on 05/29/2017 6:28:00 AM Also confirmed by Zadie Rhine ([id number]), editor Elita Quick ([id number])  on 05/29/2017 7:33:10 AM   Radiology Ct Angio Head W Or Wo Contrast  Result Date: 05/29/2017 CLINICAL DATA:  Sudden severe headache EXAM: CT ANGIOGRAPHY HEAD AND NECK TECHNIQUE: Multidetector CT imaging of the head and neck was performed using the standard protocol during bolus administration of  intravenous contrast. Multiplanar CT image reconstructions and MIPs were obtained to evaluate the vascular anatomy. Carotid stenosis measurements (when applicable) are obtained utilizing NASCET criteria, using the distal internal carotid diameter as the denominator. CONTRAST:  50mL ISOVUE-370 IOPAMIDOL (ISOVUE-370) INJECTION 76% COMPARISON:  None. FINDINGS: CT HEAD FINDINGS Brain: No evidence of acute infarction, hemorrhage, hydrocephalus, extra-axial collection or mass lesion/mass effect. Vascular: See below Skull: Normal. Negative for fracture or focal lesion. Sinuses: Imaged portions are clear. Orbits: Negative Review of the MIP images confirms the above findings CTA NECK FINDINGS Aortic arch: Normal.  Three vessel branching pattern. Right carotid system: Mild beading of the right mid ICA. No evidence of dissection. No atheromatous stenosis. Left carotid system: Subtle beading of the mid ICA. No atheromatous change, stenosis, or dissection. Vertebral arteries: Normal proximal subclavian arteries. The vertebral arteries are smooth and diffusely patent Skeleton: Mild reversal of cervical lordosis.  No acute finding Other neck: Negative Upper chest: Residual thymus which has normal thickness. No acute finding Review of the MIP images confirms the above findings CTA HEAD FINDINGS Anterior circulation: No atheromatous changes, stenosis, branch occlusion, or aneurysm. No noted vascular malformation. Posterior circulation: Codominant vertebral arteries. Dominant left AICA and right PICA. No branch occlusion, beading, stenosis, or aneurysm. Venous sinuses: Patent Anatomic variants: None Delayed phase: No abnormal intracranial enhancement. Review of the MIP images confirms the above findings IMPRESSION: 1. No acute finding. No evidence of subarachnoid hemorrhage or aneurysm. 2. 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 No indication of dissection. Electronically Signed   By: Marnee Spring M.D.   On:  05/29/2017 08:16   Dg Chest 2 View  Result Date: 05/28/2017 CLINICAL DATA:  Hypertension with left-sided neck pain radiating to the arm EXAM: CHEST - 2 VIEW COMPARISON:  03/17/2016 FINDINGS: The heart size and mediastinal contours are within normal limits. Both lungs are clear. The visualized skeletal structures are unremarkable. IMPRESSION: No active cardiopulmonary disease. Electronically Signed   By: Jasmine Pang M.D.   On: 05/28/2017 22:43   Ct Angio Neck W And/or Wo Contrast  Result Date: 05/29/2017 CLINICAL DATA:  Sudden severe headache EXAM: CT ANGIOGRAPHY HEAD AND NECK TECHNIQUE: Multidetector CT imaging of the head and neck was performed using the standard protocol during bolus administration of intravenous contrast.  Multiplanar CT image reconstructions and MIPs were obtained to evaluate the vascular anatomy. Carotid stenosis measurements (when applicable) are obtained utilizing NASCET criteria, using the distal internal carotid diameter as the denominator. CONTRAST:  50mL ISOVUE-370 IOPAMIDOL (ISOVUE-370) INJECTION 76% COMPARISON:  None. FINDINGS: CT HEAD FINDINGS Brain: No evidence of acute infarction, hemorrhage, hydrocephalus, extra-axial collection or mass lesion/mass effect. Vascular: See below Skull: Normal. Negative for fracture or focal lesion. Sinuses: Imaged portions are clear. Orbits: Negative Review of the MIP images confirms the above findings CTA NECK FINDINGS Aortic arch: Normal.  Three vessel branching pattern. Right carotid system: Mild beading of the right mid ICA. No evidence of dissection. No atheromatous stenosis. Left carotid system: Subtle beading of the mid ICA. No atheromatous change, stenosis, or dissection. Vertebral arteries: Normal proximal subclavian arteries. The vertebral arteries are smooth and diffusely patent Skeleton: Mild reversal of cervical lordosis.  No acute finding Other neck: Negative Upper chest: Residual thymus which has normal thickness. No acute finding  Review of the MIP images confirms the above findings CTA HEAD FINDINGS Anterior circulation: No atheromatous changes, stenosis, branch occlusion, or aneurysm. No noted vascular malformation. Posterior circulation: Codominant vertebral arteries. Dominant left AICA and right PICA. No branch occlusion, beading, stenosis, or aneurysm. Venous sinuses: Patent Anatomic variants: None Delayed phase: No abnormal intracranial enhancement. Review of the MIP images confirms the above findings IMPRESSION: 1. No acute finding. No evidence of subarachnoid hemorrhage or aneurysm. 2. 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 No indication of dissection. Electronically Signed   By: Marnee Spring M.D.   On: 05/29/2017 08:16    Procedures Procedures (including critical care time)  Medications Ordered in ED Medications  iopamidol (ISOVUE-370) 76 % injection (has no administration in time range)  iopamidol (ISOVUE-370) 76 % injection 50 mL (50 mLs Intravenous Contrast Given 05/29/17 0746)     Initial Impression / Assessment and Plan / ED Course  I have reviewed the triage vital signs and the nursing notes.  Pertinent labs & imaging results that were available during my care of the patient were reviewed by me and considered in my medical decision making (see chart for details).     Patient presents with complaint of intermittently elevated blood pressure for 3 days.  She is afebrile, initially hypertensive to 177/103 with every subsequent blood pressure reading documented at patient's baseline.  She is nontoxic in appearance.  She has no focal neurologic deficits. She is ambulatory without difficulty. She is complaining of an ongoing occipital headache.  Her headaches at home have been improving with Tylenol.  We will obtain CTA of the head and neck to rule out dissection.  Her primary care physician is helping set up a cardiology referral.  Imaging shows no acute findings.  No evidence of  aneurysm or subarachnoid hemorrhage.  There is suspicion for mild fibromuscular dysplasia of the cervical carotids with no indication of dissection.  Patient's EKG shows normal sinus rhythm with no significant changes from last tracing, no evidence of ischemia.  Her troponin is negative and she is not complaining of chest pain.  I doubt ACS or MI.  Chest x-ray shows no acute cardiopulmonary abnormalities.  Remainder of lab work reviewed by me shows a stable anemia and no other abnormalities.  She is stable for discharge home with follow-up with her primary care physician and eventually a cardiologist.  Encourage the patient to check her blood pressure less frequently as she has been checking it multiple times daily.  Discussed  strict ED return precautions. Pt verbalized understanding of and agreement with plan and is safe for discharge home at this time.   Final Clinical Impressions(s) / ED Diagnoses   Final diagnoses:  Hypertension, unspecified type  Bad headache    ED Discharge Orders    None      Bennye Alm 05/29/17 0848  Zadie Rhine, MD 05/30/17 [phone]

## 2017-09-11 ENCOUNTER — Encounter: Payer: Self-pay | Admitting: *Deleted

## 2017-09-14 ENCOUNTER — Encounter

## 2017-09-14 ENCOUNTER — Encounter: Payer: Self-pay | Admitting: Diagnostic Neuroimaging

## 2017-09-14 ENCOUNTER — Ambulatory Visit (INDEPENDENT_AMBULATORY_CARE_PROVIDER_SITE_OTHER): Payer: 59 | Admitting: Diagnostic Neuroimaging

## 2017-09-14 VITALS — BP 115/69 | HR 76 | Ht 67.0 in | Wt 202.4 lb

## 2017-09-14 DIAGNOSIS — G43009 Migraine without aura, not intractable, without status migrainosus: Secondary | ICD-10-CM

## 2017-09-14 DIAGNOSIS — I773 Arterial fibromuscular dysplasia: Secondary | ICD-10-CM

## 2017-09-14 MED ORDER — TOPIRAMATE 50 MG PO TABS: 50.0000 mg | ORAL_TABLET | Freq: Two times a day (BID) | ORAL | 12 refills | Status: AC

## 2017-09-14 NOTE — Patient Instructions (Addendum)
SUBTLE FIBROMUSCULAR DYSPLASIA (extracranial, internal carotid arteries; asymptomatic) - continue BP control - check CTA head / neck in 1 year (to monitor subtle FMD)  MIGRAINE WITHOUT AURA - trial of topiramate 50mg  at bedtime for migraine prevention; then may increase to twice a day after 1-2 weeks; drink plenty of water - ibuprofen, tylenol or Excedrin migraine as needed for migraine rescue

## 2017-09-14 NOTE — Progress Notes (Signed)
[facility]  PATIENT: Beverly AlertAnnissa P Beaubrun DOB: [birth year]  REFERRING CLINICIAN: Altamese CarolinaKelley, Ashton HISTORY FROM: patient  REASON FOR VISIT: new consult    HISTORICAL  CHIEF COMPLAINT:  Chief Complaint  Patient presents with  . Headache    rm 7, New Pt, "headaches for many years"    HISTORY OF PRESENT ILLNESS:   42 year old female with hypertension, here for evaluation of abnormal CT angiogram results and headaches.  Patient has history of headaches, mainly occipital, starting at age 42 years old.  Sometimes she would feel jittery and weak.  Sometimes these were associated with pounding throbbing headaches, nausea, photophobia, vertigo and pressure behind her eyes.  Triggering factors include heat, lifting her arms up or other stress factors.  Patient had headaches throughout college, and then would recur periodically few years.  Patient recalls headaches flaring up in 2007 in 2012.  In last few months since April 2019 headaches have flared up as well as blood pressure.  Patient now averaging 3-4 headaches per week.  Patient went to the emergency room for evaluation in May 2019 for headaches and hypertension, had CTA of the head and neck which showed a subtle fibromuscular dysplasia changes in the upper cervical internal carotid arteries.  Blood pressure is under better control now.  Patient had cardiology evaluation.  No evidence of fibromuscular dysplasia affecting the renal arteries.   REVIEW OF SYSTEMS: Full 14 system review of systems performed and negative with exception of: Fatigue anemia feeling hot flushing joint pain aching muscle skin sensitivity anxiety decreased energy dizziness headache weakness sleepiness.   ALLERGIES: Allergies  Allergen Reactions  . Codeine   . Hydrocodone-Acetaminophen Nausea Only    HOME MEDICATIONS: Outpatient Medications Prior to Visit  Medication Sig Dispense Refill  . amLODipine (NORVASC) 10 MG tablet Take 10 mg by mouth  daily.    Marland Kitchen. atorvastatin (LIPITOR) 20 MG tablet Take 20 mg by mouth daily.  5  . cloNIDine (CATAPRES) 0.2 MG tablet Take 0.2 mg by mouth 2 (two) times daily.    . diazepam (VALIUM) 5 MG tablet Take 5 mg by mouth every 12 (twelve) hours as needed for anxiety.    Marland Kitchen. labetalol (NORMODYNE) 100 MG tablet Take 1 tablet (100 mg total) by mouth 2 (two) times daily. 60 tablet 0  . spironolactone (ALDACTONE) 25 MG tablet Take 25 mg by mouth 2 (two) times daily.     No facility-administered medications prior to visit.     PAST MEDICAL HISTORY: Past Medical History:  Diagnosis Date  . Anemia    H/O  . Asthma    AS A CHILD  . HSV-1 infection    H/O  . Hypercholesterolemia   . Hypertension   . SAB (spontaneous abortion)    H/O    PAST SURGICAL HISTORY: Past Surgical History:  Procedure Laterality Date  . ADENOIDECTOMY    . TONSILLECTOMY  2001    FAMILY HISTORY: Family History  Problem Relation Age of Onset  . Hypertension Mother   . Bipolar disorder Mother   . Heart disease Father   . Hypertension Father   . CVA Father        age 954  . Hypertension Sister   . Heart disease Sister   . Fibroids Sister   . Heart attack Maternal Grandmother   . Heart disease Maternal Grandmother   . Hypertension Paternal Grandmother   . Heart disease Paternal Grandmother   . Thyroid disease Paternal Grandmother   . Heart disease  Paternal Grandfather   . Heart attack Paternal Grandfather     SOCIAL HISTORY: Social History   Socioeconomic History  . Marital status: Married    Spouse name: Not on file  . Number of children: 1  . Years of education: MS RN  . Highest education level: Not on file  Occupational History    Comment: RN, [employer]  Social Needs  . Financial resource strain: Not on file  . Food insecurity:    Worry: Not on file    Inability: Not on file  . Transportation needs:    Medical: Not on file    Non-medical: Not on file  Tobacco Use  . Smoking status: Never Smoker   . Smokeless tobacco: Never Used  Substance and Sexual Activity  . Alcohol use: No  . Drug use: No  . Sexual activity: Yes    Birth control/protection: Other-see comments  Lifestyle  . Physical activity:    Days per week: Not on file    Minutes per session: Not on file  . Stress: Not on file  Relationships  . Social connections:    Talks on phone: Not on file    Gets together: Not on file    Attends religious service: Not on file    Active member of club or organization: Not on file    Attends meetings of clubs or organizations: Not on file    Relationship status: Not on file  . Intimate partner violence:    Fear of current or ex partner: Not on file    Emotionally abused: Not on file    Physically abused: Not on file    Forced sexual activity: Not on file  Other Topics Concern  . Not on file  Social History Narrative   Lives with husband, child   Caffeine- none     PHYSICAL EXAM  GENERAL EXAM/CONSTITUTIONAL: Vitals:  Vitals:   09/14/17 0827  BP: 115/69  Pulse: 76  Weight: 202 lb 6.4 oz (91.8 kg)  Height: 5\' 7"  (1.702 m)     Body mass index is 31.7 kg/m. Wt Readings from Last 3 Encounters:  09/14/17 202 lb 6.4 oz (91.8 kg)  05/28/17 212 lb (96.2 kg)  03/17/16 207 lb (93.9 kg)     Patient is in no distress; well developed, nourished and groomed; neck is supple  CARDIOVASCULAR:  Examination of carotid arteries is normal; no carotid bruits  Regular rate and rhythm, no murmurs  Examination of peripheral vascular system by observation and palpation is normal  EYES:  Ophthalmoscopic exam of optic discs and posterior segments is normal; no papilledema or hemorrhages  Visual Acuity Screening   Right eye Left eye Both eyes  Without correction: 20/20 20/20   With correction:        MUSCULOSKELETAL:  Gait, strength, tone, movements noted in Neurologic exam below  NEUROLOGIC: MENTAL STATUS:  No flowsheet data found.  awake, Hobbs, oriented to  person, place and time  recent and remote memory intact  normal attention and concentration  language fluent, comprehension intact, naming intact  fund of knowledge appropriate  CRANIAL NERVE:   2nd - no papilledema on fundoscopic exam  2nd, 3rd, 4th, 6th - pupils equal and reactive to light, visual fields full to confrontation, extraocular muscles intact, no nystagmus  5th - facial sensation symmetric  7th - facial strength symmetric  8th - hearing intact  9th - palate elevates symmetrically, uvula midline  11th - shoulder shrug symmetric  12th -  tongue protrusion midline  MOTOR:   normal bulk and tone, full strength in the BUE, BLE  SENSORY:   normal and symmetric to light touch, temperature, vibration  COORDINATION:   finger-nose-finger, fine finger movements normal  REFLEXES:   deep tendon reflexes present and symmetric  GAIT/STATION:   narrow based gait; able to walk tandem; romberg is negative    DIAGNOSTIC DATA (LABS, IMAGING, TESTING) - I reviewed patient records, labs, notes, testing and imaging myself where available.  Lab Results  Component Value Date   WBC 7.7 05/28/2017   HGB 10.7 (L) 05/28/2017   HCT 32.9 (L) 05/28/2017   MCV 82.5 05/28/2017   PLT 348 05/28/2017      Component Value Date/Time   NA 139 05/28/2017 2223   K 4.2 05/28/2017 2223   CL 102 05/28/2017 2223   CO2 26 05/28/2017 2223   GLUCOSE 98 05/28/2017 2223   BUN 10 05/28/2017 2223   CREATININE 0.73 05/28/2017 2223   CALCIUM 9.9 05/28/2017 2223   PROT 7.8 03/17/2016 2111   ALBUMIN 4.3 03/17/2016 2111   AST 18 03/17/2016 2111   ALT 14 03/17/2016 2111   ALKPHOS 61 03/17/2016 2111   BILITOT 0.5 03/17/2016 2111   GFRNONAA >60 05/28/2017 2223   GFRAA >60 05/28/2017 2223   No results found for: CHOL, HDL, LDLCALC, LDLDIRECT, TRIG, CHOLHDL Lab Results  Component Value Date   HGBA1C 5.1% 05/26/2015   No results found for: VITAMINB12 Lab Results  Component Value  Date   TSH 0.624 02/02/2012     05/29/17 CTA head / neck [I reviewed images myself and agree with interpretation. Very subtle findings of extra-cranial internal carotid arteries. I reviewed and showed images to patient today. -VRP]  1. No acute finding. No evidence of subarachnoid hemorrhage or aneurysm. 2. 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 No indication of dissection.  06/25/2017 echocardiogram -EF 58%; normal global wall motion; no evidence of pulmonary hypertension  06/25/17 carotid u/s -No hemodynamically significant stenosis of ICAs. -Anterograde vertebral artery flow  06/26/2017 renal artery duplex -No evidence of occlusive disease    ASSESSMENT AND PLAN  42 y.o. year old female here with headache since age 81 years old with migraine features also found to have very subtle findings of fibromuscular dysplasia in the cervical internal carotid arteries.  This is likely an asymptomatic and incidental finding. In addition, patient does have strong history of migraine headaches.  Dx:   1. Fibromuscular dysplasia of cervicocranial artery (HCC)   2. Migraine without aura and without status migrainosus, not intractable     PLAN:  FMD (extracranial internal carotid arteries; asymptomatic) - continue BP control - repeat CTA head / neck in 1 year (to monitor subtle FMD changes)  MIGRAINE WITHOUT AURA - trial of topiramate 50mg  at bedtime for migraine prevention; then may increase to twice a day after 1-2 weeks; drink plenty of water - ibuprofen, tylenol or Excedrin migraine as needed for migraine rescue  Meds ordered this encounter  Medications  . topiramate (TOPAMAX) 50 MG tablet    Sig: Take 1 tablet (50 mg total) by mouth 2 (two) times daily.    Dispense:  60 tablet    Refill:  12   Return in about 6 months (around 03/17/2018).    Suanne Marker, MD 09/14/2017, 8:41 AM Certified in Neurology, Neurophysiology and Neuroimaging  [facility]  [facility] [address] [place], [place] [address] [phone]

## 2018-03-22 ENCOUNTER — Telehealth: Payer: Self-pay | Admitting: *Deleted

## 2018-03-22 ENCOUNTER — Ambulatory Visit: Payer: 59 | Admitting: Diagnostic Neuroimaging

## 2018-03-22 NOTE — Telephone Encounter (Signed)
Patient was no show for follow up today. 

## 2018-03-23 ENCOUNTER — Encounter: Payer: Self-pay | Admitting: Diagnostic Neuroimaging

## 2018-03-25 ENCOUNTER — Ambulatory Visit: Payer: 59 | Admitting: Family Medicine

## 2018-03-29 NOTE — Progress Notes (Signed)
PATIENT: Beverly Hobbs DOB: 1975/10/23  REASON FOR VISIT: follow up HISTORY FROM: patient  Chief Complaint  Patient presents with  . Follow-up    pt alone, rm 5. pt states that she has not been using the clonidine only as needed. the migraines have subsided. she states the neck and upper back pain is there.     HISTORY OF PRESENT ILLNESS: Today 04/01/18 Beverly Hobbs is a 43 y.o. female here today for follow up of migraines and fibromuscular dysplasia. She was started on topiramate at her last visit in 08/2017. Blood pressure has been managed well with PCP. She reports headaches are significantly better since starting topiramate. She has continued to have an echoing in her ears associated with a pressure sensation around her ears and dizziness. Symptoms last about 30-40 minutes and usually are completely resolved within several hours. She only notices this if she is making loud noises like cheering for her daughter at a game or when she is singing at church. Can happen 2-3 times weekly or non at all. She has a pressure like pain in the occipital region. Massage makes this better. Sitting down and being quiet helps. It does not require medications for treatment. In a separate matter, she has some tightness/soreness in bilateral trapezius muscles. Heat and rest helps. She denies ringing in her ears. No LOC, confusion or changes in speech or gait.  She had an eye exam last in January that was normal.   She has changed her diet. She has lost 35 pounds. She reports that her BP has been 110-120/70-80. She is taking labetalol 100mg  twice daily and amlodipine 10mg  daily. She also takes spironlatone 25 mg daily (twice daily dosing causes dizziness). She has not needed to take clonidine since May 2019.    HISTORY: (copied from Dr Richrd Humbles note on 09/14/2017) 43 year old female with hypertension, here for evaluation of abnormal CT angiogram results and headaches.  Patient has history of  headaches, mainly occipital, starting at age 50 years old.  Sometimes she would feel jittery and weak.  Sometimes these were associated with pounding throbbing headaches, nausea, photophobia, vertigo and pressure behind her eyes.  Triggering factors include heat, lifting her arms up or other stress factors.  Patient had headaches throughout college, and then would recur periodically few years.  Patient recalls headaches flaring up in 2007 in 2012.  In last few months since April 2019 headaches have flared up as well as blood pressure.  Patient now averaging 3-4 headaches per week.  Patient went to the emergency room for evaluation in May 2019 for headaches and hypertension, had CTA of the head and neck which showed a subtle fibromuscular dysplasia changes in the upper cervical internal carotid arteries.  Blood pressure is under better control now.  Patient had cardiology evaluation.  No evidence of fibromuscular dysplasia affecting the renal arteries.   REVIEW OF SYSTEMS: Out of a complete 14 system review of symptoms, the patient complains only of the following symptoms, fatigue, joint pain, back pain, achy muscles, neck pain, anemia, headaches and all other reviewed systems are negative.  ALLERGIES: Allergies  Allergen Reactions  . Codeine   . Hydrocodone-Acetaminophen Nausea Only    HOME MEDICATIONS: Outpatient Medications Prior to Visit  Medication Sig Dispense Refill  . amLODipine (NORVASC) 10 MG tablet Take 10 mg by mouth daily.    . cloNIDine (CATAPRES) 0.2 MG tablet Take 0.2 mg by mouth as needed.     . diazepam (VALIUM) 5  MG tablet Take 5 mg by mouth every 12 (twelve) hours as needed for anxiety.    Marland Kitchen labetalol (NORMODYNE) 100 MG tablet Take 1 tablet (100 mg total) by mouth 2 (two) times daily. 60 tablet 0  . spironolactone (ALDACTONE) 25 MG tablet Take 25 mg by mouth 2 (two) times daily.    Marland Kitchen topiramate (TOPAMAX) 50 MG tablet Take 1 tablet (50 mg total) by mouth 2 (two) times  daily. 60 tablet 12  . atorvastatin (LIPITOR) 20 MG tablet Take 20 mg by mouth daily.  5   No facility-administered medications prior to visit.     PAST MEDICAL HISTORY: Past Medical History:  Diagnosis Date  . Anemia    H/O  . Asthma    AS A CHILD  . HSV-1 infection    H/O  . Hypercholesterolemia   . Hypertension   . SAB (spontaneous abortion)    H/O    PAST SURGICAL HISTORY: Past Surgical History:  Procedure Laterality Date  . ADENOIDECTOMY    . TONSILLECTOMY  2001    FAMILY HISTORY: Family History  Problem Relation Age of Onset  . Hypertension Mother   . Bipolar disorder Mother   . Fibromyalgia Mother   . Heart disease Father   . Hypertension Father   . CVA Father        age 41  . Hypertension Sister   . Heart disease Sister   . Fibroids Sister   . Heart attack Maternal Grandmother   . Heart disease Maternal Grandmother   . Hypertension Paternal Grandmother   . Heart disease Paternal Grandmother   . Thyroid disease Paternal Grandmother   . Heart disease Paternal Grandfather   . Heart attack Paternal Grandfather     SOCIAL HISTORY: Social History   Socioeconomic History  . Marital status: Married    Spouse name: Not on file  . Number of children: 1  . Years of education: MS RN  . Highest education level: Not on file  Occupational History    Comment: RN, [employer]  Social Needs  . Financial resource strain: Not on file  . Food insecurity:    Worry: Not on file    Inability: Not on file  . Transportation needs:    Medical: Not on file    Non-medical: Not on file  Tobacco Use  . Smoking status: Never Smoker  . Smokeless tobacco: Never Used  Substance and Sexual Activity  . Alcohol use: No  . Drug use: No  . Sexual activity: Yes    Birth control/protection: Other-see comments  Lifestyle  . Physical activity:    Days per week: Not on file    Minutes per session: Not on file  . Stress: Not on file  Relationships  . Social connections:     Talks on phone: Not on file    Gets together: Not on file    Attends religious service: Not on file    Active member of club or organization: Not on file    Attends meetings of clubs or organizations: Not on file    Relationship status: Not on file  . Intimate partner violence:    Fear of current or ex partner: Not on file    Emotionally abused: Not on file    Physically abused: Not on file    Forced sexual activity: Not on file  Other Topics Concern  . Not on file  Social History Narrative   Lives with husband, child   Caffeine-  none      PHYSICAL EXAM  Vitals:   04/01/18 0746  Weight: 176 lb (79.8 kg)  Height: 5' 6.5" (1.689 m)   Body mass index is 27.98 kg/m.  Generalized: Well developed, in no acute distress  Cardiology: normal rate and rhythm, no murmur noted Neurological examination  Mentation: Hobbs oriented to time, place, history taking. Follows all commands speech and language fluent Cranial nerve II-XII: Pupils were equal round reactive to light. Extraocular movements were full, visual field were full on confrontational test. Facial sensation and strength were normal. Uvula tongue midline. Head turning and shoulder shrug  were normal and symmetric. Motor: The motor testing reveals 5 over 5 strength of all 4 extremities. Good symmetric motor tone is noted throughout. Tenderness to palpation of bilateral trapezius muscles.  Sensory: Sensory testing is intact to soft touch, temp and vibration on all 4 extremities. No evidence of extinction is noted.  Coordination: Cerebellar testing reveals good finger-nose-finger bilaterally.  Gait and station: Gait is normal.  Reflexes: Deep tendon reflexes are symmetric and normal bilaterally.   DIAGNOSTIC DATA (LABS, IMAGING, TESTING) - I reviewed patient records, labs, notes, testing and imaging myself where available.  No flowsheet data found.   Lab Results  Component Value Date   WBC 7.7 05/28/2017   HGB 10.7 (L)  05/28/2017   HCT 32.9 (L) 05/28/2017   MCV 82.5 05/28/2017   PLT 348 05/28/2017      Component Value Date/Time   NA 139 05/28/2017 2223   K 4.2 05/28/2017 2223   CL 102 05/28/2017 2223   CO2 26 05/28/2017 2223   GLUCOSE 98 05/28/2017 2223   BUN 10 05/28/2017 2223   CREATININE 0.73 05/28/2017 2223   CALCIUM 9.9 05/28/2017 2223   PROT 7.8 03/17/2016 2111   ALBUMIN 4.3 03/17/2016 2111   AST 18 03/17/2016 2111   ALT 14 03/17/2016 2111   ALKPHOS 61 03/17/2016 2111   BILITOT 0.5 03/17/2016 2111   GFRNONAA >60 05/28/2017 2223   GFRAA >60 05/28/2017 2223   No results found for: CHOL, HDL, LDLCALC, LDLDIRECT, TRIG, CHOLHDL Lab Results  Component Value Date   HGBA1C 5.1% 05/26/2015   No results found for: [id number] Lab Results  Component Value Date   TSH 0.624 02/02/2012   CT Angio Head and Neck 05/29/2017 IMPRESSION: 1. No acute finding. No evidence of subarachnoid hemorrhage or aneurysm. 2. 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 No indication of dissection.    ASSESSMENT AND PLAN 43 y.o. year old female  has a past medical history of Anemia, Asthma, HSV-1 infection, Hypercholesterolemia, Hypertension, and SAB (spontaneous abortion). here with     ICD-10-CM   1. Migraine without aura and without status migrainosus, not intractable G43.009   2. Fibromuscular dysplasia of cervicocranial artery (HCC) I77.3     She reports feeling significantly better from a headache standpoint. She is tolerating topiramate well with no obvious adverse effects. We will continue topiramate as prescribed. She describes an echoing and pressure sensation around her ears when making loud noises. No tinnitus or TIA signs. BP has been normal. We will repeat CTA of head and neck at next follow up in 3 months. We will also refer her to PT for dry needling due to reports of neck pain and stiffness. She will follow up with me in 3 months, sooner if needed.     No orders of the  defined types were placed in this encounter.    No orders of the  defined types were placed in this encounter.     I spent 15 minutes with the patient. 50% of this time was spent counseling and educating patient on plan of care and medications.    Shawnie Dapper, FNP-C 04/01/2018, 7:49 AM [facility] [address], [address] [place], [place] [address] [phone]

## 2018-04-01 ENCOUNTER — Ambulatory Visit (INDEPENDENT_AMBULATORY_CARE_PROVIDER_SITE_OTHER): Payer: 59 | Admitting: Family Medicine

## 2018-04-01 ENCOUNTER — Encounter: Payer: Self-pay | Admitting: Family Medicine

## 2018-04-01 VITALS — BP 109/72 | HR 76 | Ht 66.5 in | Wt 176.0 lb

## 2018-04-01 DIAGNOSIS — I773 Arterial fibromuscular dysplasia: Secondary | ICD-10-CM | POA: Diagnosis not present

## 2018-04-01 DIAGNOSIS — M542 Cervicalgia: Secondary | ICD-10-CM

## 2018-04-01 DIAGNOSIS — G43009 Migraine without aura, not intractable, without status migrainosus: Secondary | ICD-10-CM | POA: Diagnosis not present

## 2018-04-01 NOTE — Progress Notes (Signed)
I reviewed note and agree with plan.   Suanne Marker, MD 04/01/2018, 11:37 AM Certified in Neurology, Neurophysiology and Neuroimaging  [facility] [address], [address] [place], [place] [address] [phone]

## 2018-04-01 NOTE — Patient Instructions (Signed)
Continue topiramate as prescribed.   Monitor BP (close follow up with PCP and cardiology)  We will repeat CTA of head and neck at next follow up in 3 months.   Referral to PT for dry needling.    Neck Exercises Neck exercises can be important for many reasons:  They can help you to improve and maintain flexibility in your neck. This can be especially important as you age.  They can help to make your neck stronger. This can make movement easier.  They can reduce or prevent neck pain.  They may help your upper back. Ask your health care provider which neck exercises would be best for you. Exercises to improve neck flexibility Neck stretch Repeat this exercise 3-5 times. 1. Do this exercise while standing or while sitting in a chair. 2. Place your feet flat on the floor, shoulder-width apart. 3. Slowly turn your head to the right. Turn it all the way to the right so you can look over your right shoulder. Do not tilt or tip your head. 4. Hold this position for 10-30 seconds. 5. Slowly turn your head to the left, to look over your left shoulder. 6. Hold this position for 10-30 seconds.  Neck retraction Repeat this exercise 8-10 times. Do this 3-4 times a day or as told by your health care provider. 1. Do this exercise while standing or while sitting in a sturdy chair. 2. Look straight ahead. Do not bend your neck. 3. Use your fingers to push your chin backward. Do not bend your neck for this movement. Continue to face straight ahead. If you are doing the exercise properly, you will feel a slight sensation in your throat and a stretch at the back of your neck. 4. Hold the stretch for 1-2 seconds. Relax and repeat. Exercises to improve neck strength Neck press Repeat this exercise 10 times. Do it first thing in the morning and right before bed or as told by your health care provider. 1. Lie on your back on a firm bed or on the floor with a pillow under your head. 2. Use your neck  muscles to push your head down on the pillow and straighten your spine. 3. Hold the position as well as you can. Keep your head facing up and your chin tucked. 4. Slowly count to 5 while holding this position. 5. Relax for a few seconds. Then repeat. Isometric strengthening Do a full set of these exercises 2 times a day or as told by your health care provider. 1. Sit in a supportive chair and place your hand on your forehead. 2. Push forward with your head and neck while pushing back with your hand. Hold for 10 seconds. 3. Relax. Then repeat the exercise 3 times. 4. Next, do thesequence again, this time putting your hand against the back of your head. Use your head and neck to push backward against the hand pressure. 5. Finally, do the same exercise on either side of your head, pushing sideways against the pressure of your hand. Prone head lifts Repeat this exercise 5 times. Do this 2 times a day or as told by your health care provider. 1. Lie face-down, resting on your elbows so that your chest and upper back are raised. 2. Start with your head facing downward, near your chest. Position your chin either on or near your chest. 3. Slowly lift your head upward. Lift until you are looking straight ahead. Then continue lifting your head as far back as you  can stretch. 4. Hold your head up for 5 seconds. Then slowly lower it to your starting position. Supine head lifts Repeat this exercise 8-10 times. Do this 2 times a day or as told by your health care provider. 1. Lie on your back, bending your knees to point to the ceiling and keeping your feet flat on the floor. 2. Lift your head slowly off the floor, raising your chin toward your chest. 3. Hold for 5 seconds. 4. Relax and repeat. Scapular retraction Repeat this exercise 5 times. Do this 2 times a day or as told by your health care provider. 1. Stand with your arms at your sides. Look straight ahead. 2. Slowly pull both shoulders backward  and downward until you feel a stretch between your shoulder blades in your upper back. 3. Hold for 10-30 seconds. 4. Relax and repeat. Contact a health care provider if:  Your neck pain or discomfort gets much worse when you do an exercise.  Your neck pain or discomfort does not improve within 2 hours after you exercise. If you have any of these problems, stop exercising right away. Do not do the exercises again unless your health care provider says that you can. Get help right away if:  You develop sudden, severe neck pain. If this happens, stop exercising right away. Do not do the exercises again unless your health care provider says that you can. This information is not intended to replace advice given to you by your health care provider. Make sure you discuss any questions you have with your health care provider. Document Released: 12/25/2014 Document Revised: 05/19/2017 Document Reviewed: 07/24/2014 Elsevier Interactive Patient Education  2019 [employer].

## 2018-06-22 ENCOUNTER — Telehealth: Payer: Self-pay

## 2018-06-22 NOTE — Telephone Encounter (Signed)
Spoke with the patient and we were able to r/s her for 08/26/2018 at 7:30 am. Patient is aware of new appt and time.

## 2018-07-02 ENCOUNTER — Ambulatory Visit: Payer: 59 | Admitting: Family Medicine

## 2018-07-12 ENCOUNTER — Ambulatory Visit: Payer: 59 | Admitting: Diagnostic Neuroimaging

## 2018-08-10 ENCOUNTER — Other Ambulatory Visit: Payer: Self-pay

## 2018-08-16 ENCOUNTER — Telehealth: Payer: Self-pay | Admitting: Family Medicine

## 2018-08-16 NOTE — Telephone Encounter (Signed)
I called patient and LVM regarding her 7/30 appt. I advised patient that her 7:30 appt will need to be rescheduled, due to unavailable schedule time. Advised that our office currently does not schedule appts before 8am. Requested patient call back.

## 2018-08-17 NOTE — Telephone Encounter (Signed)
Called patient again today and LVM advising that her 7:30 am appt will be moved to 8 am the same day due to 7:30 being an unavailable schedule time. Advised in voicemail that patient will still need to arrive by 730 to check in. Requested patient call back if this will not work for her.

## 2018-08-25 NOTE — Progress Notes (Deleted)
PATIENT: Beverly Hobbs DOB: 1975-10-14  REASON FOR VISIT: follow up HISTORY FROM: patient  No chief complaint on file.    HISTORY OF PRESENT ILLNESS: Today 08/25/18 Beverly Hobbs is a 43 y.o. female here today for follow up of migraines and fibromuscular dysplasia.    HISTORY: (copied from  note on 04/01/2018)  Beverly Hobbs is a 43 y.o. female here today for follow up of migraines and fibromuscular dysplasia. She was started on topiramate at her last visit in 08/2017. Blood pressure has been managed well with PCP. She reports headaches are significantly better since starting topiramate. She has continued to have an echoing in her ears associated with a pressure sensation around her ears and dizziness. Symptoms last about 30-40 minutes and usually are completely resolved within several hours. She only notices this if she is making loud noises like cheering for her daughter at a game or when she is singing at church. Can happen 2-3 times weekly or non at all. She has a pressure like pain in the occipital region. Massage makes this better. Sitting down and being quiet helps. It does not require medications for treatment. In a separate matter, she has some tightness/soreness in bilateral trapezius muscles. Heat and rest helps. She denies ringing in her ears. No LOC, confusion or changes in speech or gait.  She had an eye exam last in January that was normal.   She has changed her diet. She has lost 35 pounds. She reports that her BP has been 110-120/70-80. She is taking labetalol 100mg  twice daily and amlodipine 10mg  daily. She also takes spironlatone 25 mg daily (twice daily dosing causes dizziness). She has not needed to take clonidine since May 2019.    HISTORY: (copied from Dr Gladstone Lighter note on 09/14/2017) 43 year old female with hypertension, here for evaluation of abnormal CT angiogram results and headaches.  Patient has history of headaches, mainly occipital, starting at age  65 years old. Sometimes she would feel jittery and weak. Sometimes these were associated with pounding throbbing headaches, nausea, photophobia, vertigo and pressure behind her eyes. Triggering factors include heat, lifting her arms up or other stress factors. Patient had headaches throughout college, and then would recur periodically few years. Patient recalls headaches flaring up in 2007 in 2012.  In last few months since April 2019 headaches have flared up as well as blood pressure. Patient now averaging 3-4 headaches per week. Patient went to the emergency room for evaluation in May 2019 for headaches and hypertension, had CTA of the head and neck which showed a subtle fibromuscular dysplasia changes in the upper cervical internal carotid arteries.  Blood pressure is under better control now. Patient had cardiology evaluation. No evidence of fibromuscular dysplasia affecting the renal arteries.  REVIEW OF SYSTEMS: Out of a complete 14 system review of symptoms, the patient complains only of the following symptoms, and all other reviewed systems are negative.  ALLERGIES: Allergies  Allergen Reactions  . Codeine   . Hydrocodone-Acetaminophen Nausea Only    HOME MEDICATIONS: Outpatient Medications Prior to Visit  Medication Sig Dispense Refill  . amLODipine (NORVASC) 10 MG tablet Take 10 mg by mouth daily.    . cloNIDine (CATAPRES) 0.2 MG tablet Take 0.2 mg by mouth as needed.     . diazepam (VALIUM) 5 MG tablet Take 5 mg by mouth every 12 (twelve) hours as needed for anxiety.    Marland Kitchen labetalol (NORMODYNE) 100 MG tablet Take 1 tablet (100 mg total) by  mouth 2 (two) times daily. 60 tablet 0  . spironolactone (ALDACTONE) 25 MG tablet Take 25 mg by mouth 2 (two) times daily.    Marland Kitchen. topiramate (TOPAMAX) 50 MG tablet Take 1 tablet (50 mg total) by mouth 2 (two) times daily. 60 tablet 12   No facility-administered medications prior to visit.     PAST MEDICAL HISTORY: Past Medical  History:  Diagnosis Date  . Anemia    H/O  . Asthma    AS A CHILD  . HSV-1 infection    H/O  . Hypercholesterolemia   . Hypertension   . SAB (spontaneous abortion)    H/O    PAST SURGICAL HISTORY: Past Surgical History:  Procedure Laterality Date  . ADENOIDECTOMY    . TONSILLECTOMY  2001    FAMILY HISTORY: Family History  Problem Relation Age of Onset  . Hypertension Mother   . Bipolar disorder Mother   . Fibromyalgia Mother   . Heart disease Father   . Hypertension Father   . CVA Father        age 44  . Hypertension Sister   . Heart disease Sister   . Fibroids Sister   . Heart attack Maternal Grandmother   . Heart disease Maternal Grandmother   . Hypertension Paternal Grandmother   . Heart disease Paternal Grandmother   . Thyroid disease Paternal Grandmother   . Heart disease Paternal Grandfather   . Heart attack Paternal Grandfather     SOCIAL HISTORY: Social History   Socioeconomic History  . Marital status: Married    Spouse name: Not on file  . Number of children: 1  . Years of education: MS RN  . Highest education level: Not on file  Occupational History    Comment: RN, [employer]  Social Needs  . Financial resource strain: Not on file  . Food insecurity    Worry: Not on file    Inability: Not on file  . Transportation needs    Medical: Not on file    Non-medical: Not on file  Tobacco Use  . Smoking status: Never Smoker  . Smokeless tobacco: Never Used  Substance and Sexual Activity  . Alcohol use: No  . Drug use: No  . Sexual activity: Yes    Birth control/protection: Other-see comments  Lifestyle  . Physical activity    Days per week: Not on file    Minutes per session: Not on file  . Stress: Not on file  Relationships  . Social Musicianconnections    Talks on phone: Not on file    Gets together: Not on file    Attends religious service: Not on file    Active member of club or organization: Not on file    Attends meetings of clubs or  organizations: Not on file    Relationship status: Not on file  . Intimate partner violence    Fear of current or ex partner: Not on file    Emotionally abused: Not on file    Physically abused: Not on file    Forced sexual activity: Not on file  Other Topics Concern  . Not on file  Social History Narrative   Lives with husband, child   Caffeine- none      PHYSICAL EXAM  There were no vitals filed for this visit. There is no height or weight on file to calculate BMI.  Generalized: Well developed, in no acute distress  Cardiology: normal rate and rhythm, no murmur noted Neurological  examination  Mentation: Alert oriented to time, place, history taking. Follows all commands speech and language fluent Cranial nerve II-XII: Pupils were equal round reactive to light. Extraocular movements were full, visual field were full on confrontational test. Facial sensation and strength were normal. Uvula tongue midline. Head turning and shoulder shrug  were normal and symmetric. Motor: The motor testing reveals 5 over 5 strength of all 4 extremities. Good symmetric motor tone is noted throughout.  Sensory: Sensory testing is intact to soft touch on all 4 extremities. No evidence of extinction is noted.  Coordination: Cerebellar testing reveals good finger-nose-finger and heel-to-shin bilaterally.  Gait and station: Gait is normal. Tandem gait is normal. Romberg is negative. No drift is seen.  Reflexes: Deep tendon reflexes are symmetric and normal bilaterally.   DIAGNOSTIC DATA (LABS, IMAGING, TESTING) - I reviewed patient records, labs, notes, testing and imaging myself where available.  No flowsheet data found.   Lab Results  Component Value Date   WBC 7.7 05/28/2017   HGB 10.7 (L) 05/28/2017   HCT 32.9 (L) 05/28/2017   MCV 82.5 05/28/2017   PLT 348 05/28/2017      Component Value Date/Time   NA 139 05/28/2017 2223   K 4.2 05/28/2017 2223   CL 102 05/28/2017 2223   CO2 26  05/28/2017 2223   GLUCOSE 98 05/28/2017 2223   BUN 10 05/28/2017 2223   CREATININE 0.73 05/28/2017 2223   CALCIUM 9.9 05/28/2017 2223   PROT 7.8 03/17/2016 2111   ALBUMIN 4.3 03/17/2016 2111   AST 18 03/17/2016 2111   ALT 14 03/17/2016 2111   ALKPHOS 61 03/17/2016 2111   BILITOT 0.5 03/17/2016 2111   GFRNONAA >60 05/28/2017 2223   GFRAA >60 05/28/2017 2223   No results found for: CHOL, HDL, LDLCALC, LDLDIRECT, TRIG, CHOLHDL Lab Results  Component Value Date   HGBA1C 5.1% 05/26/2015   No results found for: VITAMINB12 Lab Results  Component Value Date   TSH 0.624 02/02/2012       ASSESSMENT AND PLAN 43 y.o. year old female  has a past medical history of Anemia, Asthma, HSV-1 infection, Hypercholesterolemia, Hypertension, and SAB (spontaneous abortion). here with ***    ICD-10-CM   1. Migraine without aura and without status migrainosus, not intractable  G43.009   2. Fibromuscular dysplasia of cervicocranial artery (HCC)  I77.3        No orders of the defined types were placed in this encounter.    No orders of the defined types were placed in this encounter.     I spent 15 minutes with the patient. 50% of this time was spent counseling and educating patient on plan of care and medications.    Shawnie Dappermy Jorgina Binning, FNP-C 08/25/2018, 4:01 PM [facility] [address], [address] [place], [place] [address] [phone]

## 2018-08-26 ENCOUNTER — Ambulatory Visit: Payer: Self-pay | Admitting: Family Medicine

## 2019-01-07 ENCOUNTER — Ambulatory Visit: Payer: Self-pay | Admitting: Cardiology

## 2020-01-17 ENCOUNTER — Other Ambulatory Visit: Payer: Self-pay | Admitting: Family Medicine

## 2020-01-17 DIAGNOSIS — R102 Pelvic and perineal pain: Secondary | ICD-10-CM

## 2020-01-17 DIAGNOSIS — Z8742 Personal history of other diseases of the female genital tract: Secondary | ICD-10-CM

## 2020-02-07 ENCOUNTER — Ambulatory Visit
Admission: RE | Admit: 2020-02-07 | Discharge: 2020-02-07 | Disposition: A | Discharge status: Home / Self Care | Payer: 59 | Source: Ambulatory Visit | Attending: Family Medicine | Admitting: Family Medicine

## 2020-02-07 DIAGNOSIS — R102 Pelvic and perineal pain: Secondary | ICD-10-CM

## 2020-02-07 DIAGNOSIS — Z8742 Personal history of other diseases of the female genital tract: Secondary | ICD-10-CM

## 2020-02-07 IMAGING — US US PELVIS COMPLETE WITH TRANSVAGINAL
1 series · 13 of 25 positions shown · non-contrast
Comparison: None

CLINICAL DATA: Heavy vaginal bleeding with clots, pelvic pain,
bleeding since [DATE]. History of adenomyosis, hypertension

EXAM:
TRANSABDOMINAL AND TRANSVAGINAL ULTRASOUND OF PELVIS
TECHNIQUE: Both transabdominal and transvaginal ultrasound examinations of the
pelvis were performed. Transabdominal technique was performed for
global imaging of the pelvis including uterus, ovaries, adnexal
regions, and pelvic cul-de-sac. It was necessary to proceed with
endovaginal exam following the transabdominal exam to visualize the
endometrium and ovaries.

[Series 1: us pelvis complete with transvaginal · 0.26mm/px · 97 acquisitions, 13 frames shown]
[im 1/97]
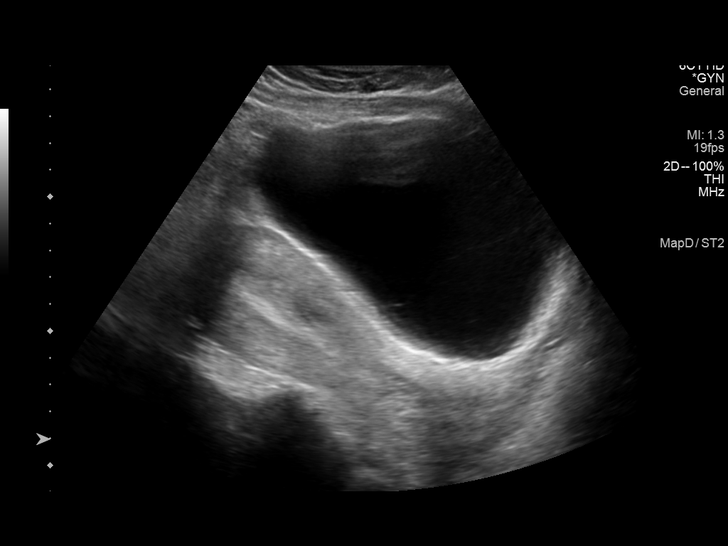
[im 9/97]
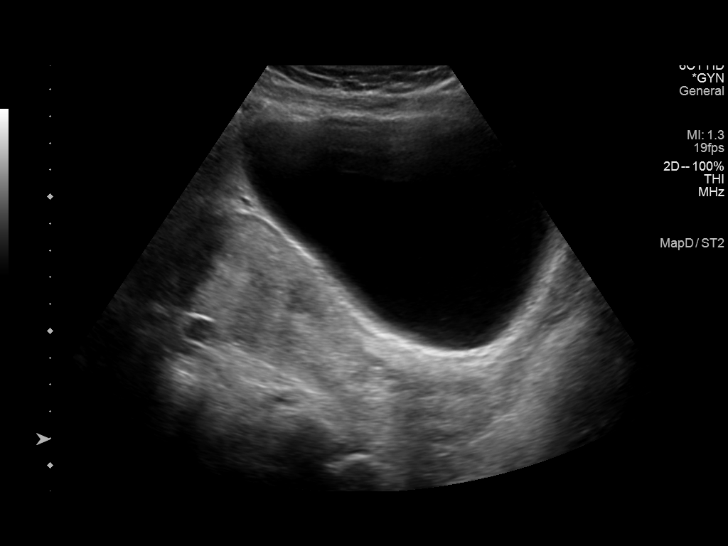
[im 17/97]
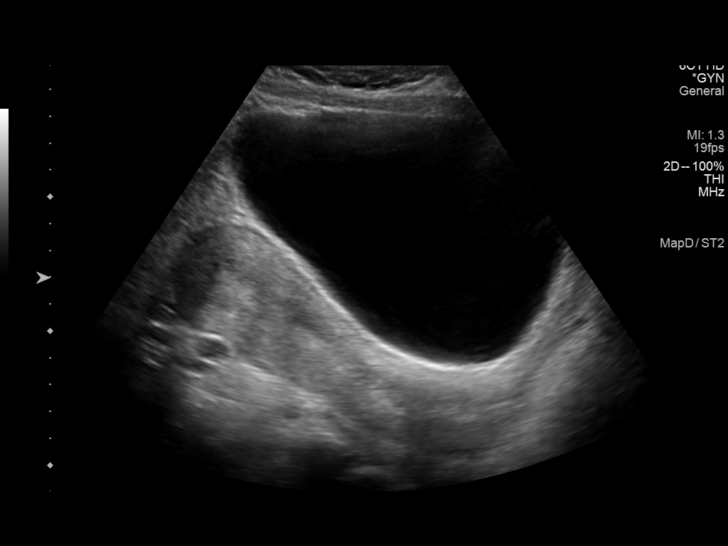
[im 25/97]
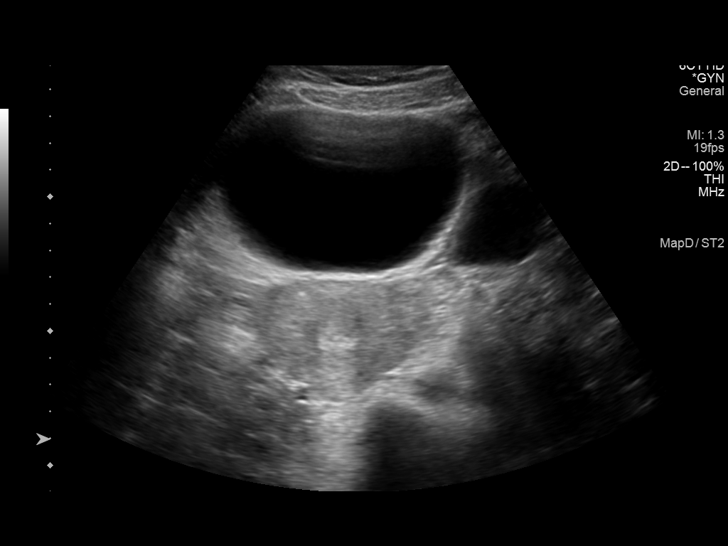
[im 33/97]
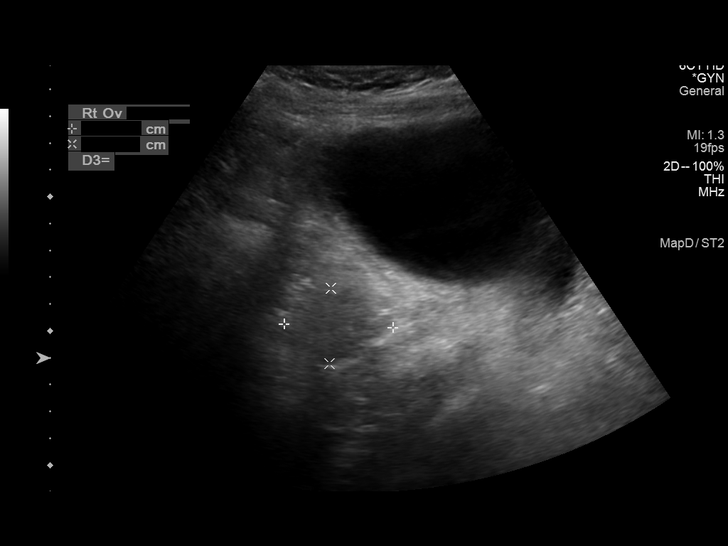
[im 41/97]
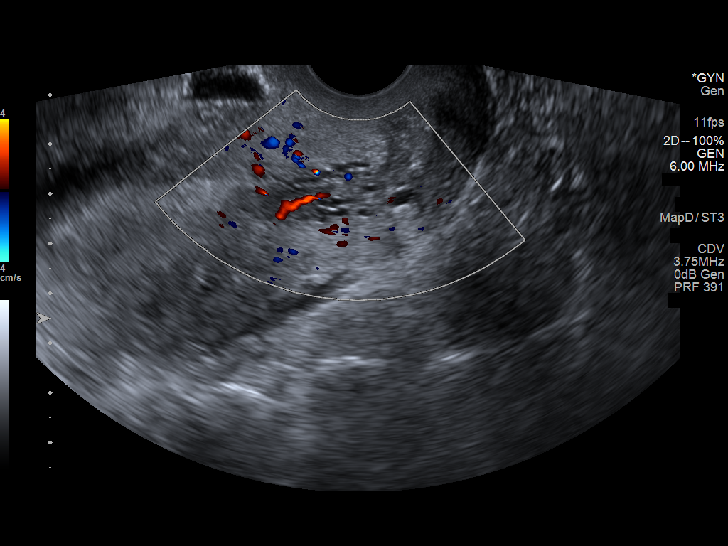
[im 49/97]
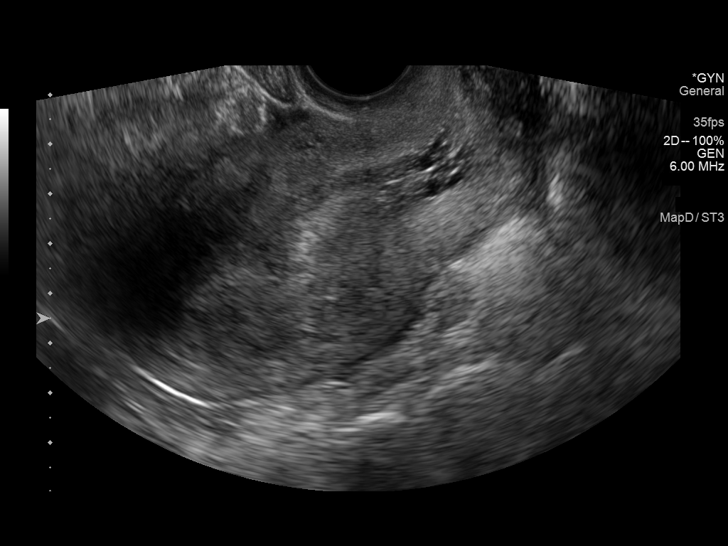
[im 57/97]
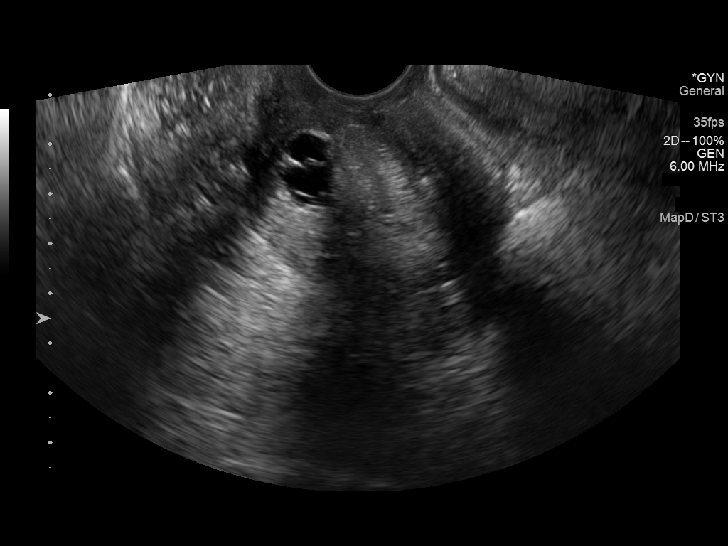
[im 65/97]
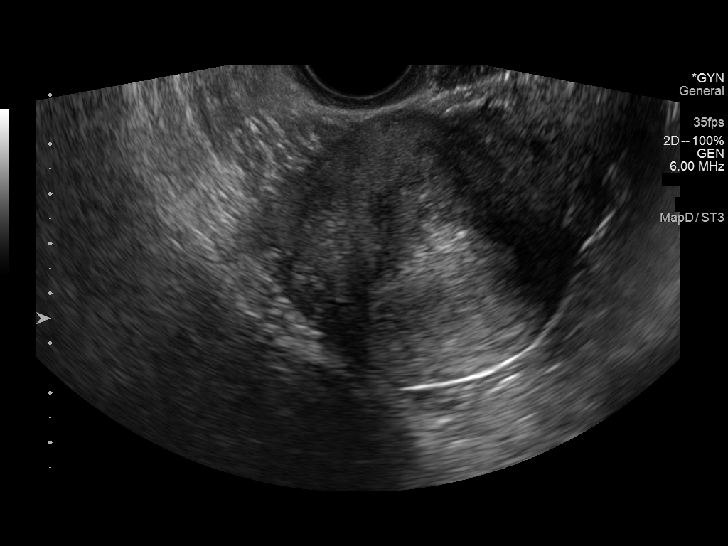
[im 73/97]
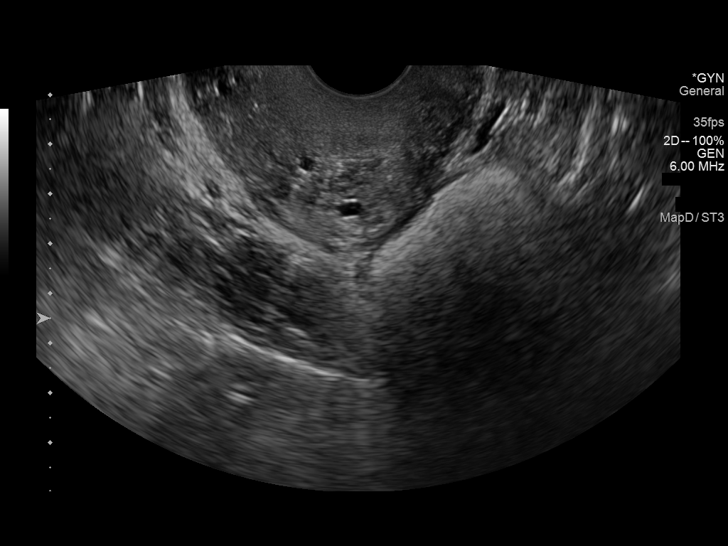
[im 81/97]
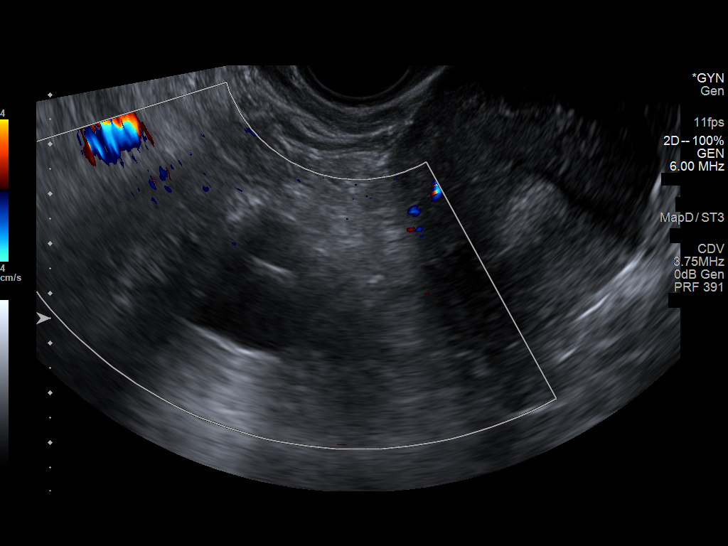
[im 89/97]
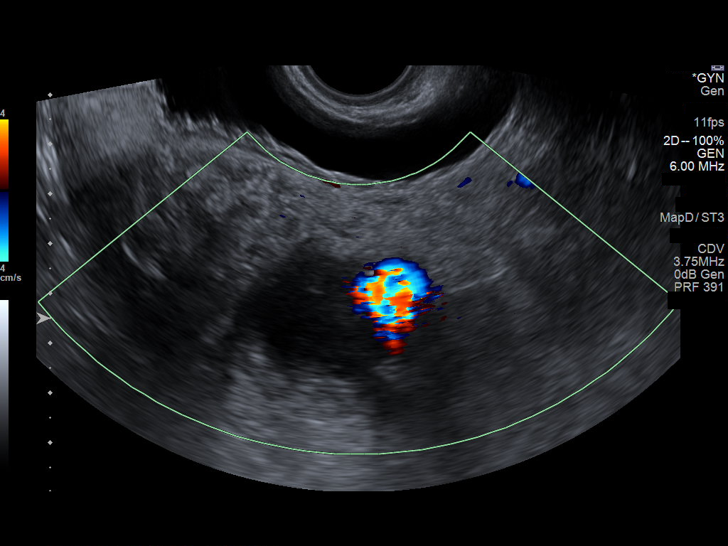
[im 97/97]
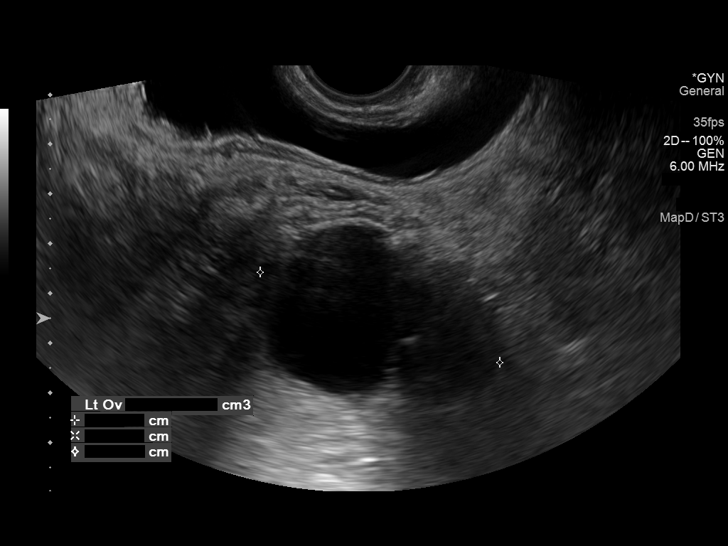

[13 of 25 positions shown; findings below may reference images not displayed]

FINDINGS: Uterus

Measurements: 13.4 x 4.4 x 6.6 cm = volume: 203 mL. Anteverted.
Heterogeneous myometrium. Focal area of heterogeneous echogenicity
in the anterior uterine wall consistent with either focal
adenomyosis or an intramural leiomyoma.

Endometrium

Thickness: 12 mm. Focal hypoechoic nodule identified at endometrium
at upper to mid uterus measuring 14 x 13 x 15 mm favor submucosal
leiomyoma no endometrial polyp is not completely excluded.
Additional area of cystic change identified centrally at the cervix
along the endocervical canal, 19 x 12 x 19 mm likely prominent
endocervical glandular tissue. Several endocervical cysts are seen,
largest septated and 1.8 cm diameter.

Right ovary

Measurements: 4.1 x 2.8 x 3.2 cm = volume: 19 mL. Normal morphology
without mass

Left ovary

Measurements: 5.6 x 4.1 x 5.2 cm = volume: 62 mL. Simple cyst within
LEFT ovary 3.3 x 3.0 x 2.6 cm; no follow-up imaging recommended. No
additional masses.

Other findings

Trace free pelvic fluid.  No additional adnexal masses.
IMPRESSION: 3.3 cm diameter simple cyst LEFT ovary 3.3 cm greatest size; no
follow-up imaging recommended.

Focal area of heterogeneous echogenicity in the anterior uterine
wall consistent with focal adenomyosis or an intramural leiomyoma.

15 mm hypoechoic nodule at upper to mid endometrial complex favor
submucosal leiomyoma though an endometrial polyp is not excluded;
consider sonohysterogram for further evaluation, prior to
hysteroscopy or endometrial biopsy.

## 2020-06-21 ENCOUNTER — Other Ambulatory Visit: Payer: Self-pay | Admitting: Family Medicine

## 2020-06-21 DIAGNOSIS — R1032 Left lower quadrant pain: Secondary | ICD-10-CM

## 2020-07-06 ENCOUNTER — Ambulatory Visit
Admission: RE | Admit: 2020-07-06 | Discharge: 2020-07-06 | Disposition: A | Discharge status: Home / Self Care | Payer: No Typology Code available for payment source | Source: Ambulatory Visit | Attending: Family Medicine | Admitting: Family Medicine

## 2020-07-06 DIAGNOSIS — R1032 Left lower quadrant pain: Secondary | ICD-10-CM

## 2020-07-06 IMAGING — US US PELVIS LIMITED
1 series · 14 of 15 positions shown · non-contrast
Comparison: None.

CLINICAL DATA: Palpable abnormality in the left groin for 2 months,
initial encounter

EXAM:
LIMITED ULTRASOUND OF PELVIS
TECHNIQUE: Limited transabdominal ultrasound examination of the pelvis was
performed.

[Series 1: us pelvis limited · 0.06mm/px · 15 acquisitions, 14 frames shown]
[im 1/15]
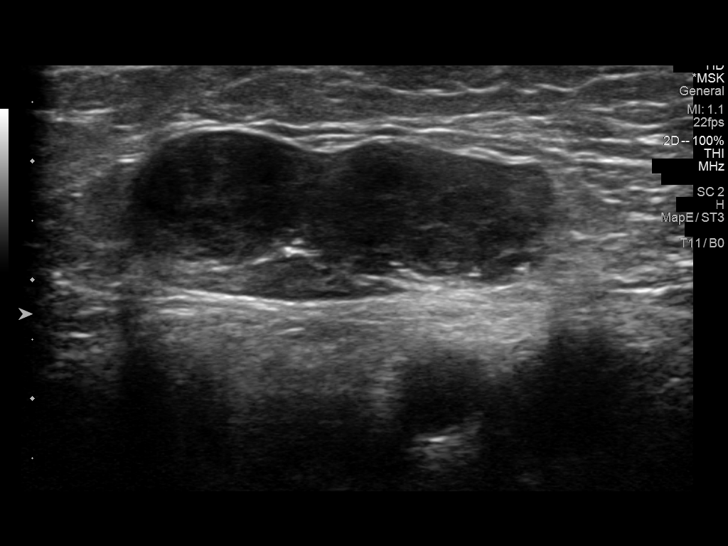
[im 2/15]
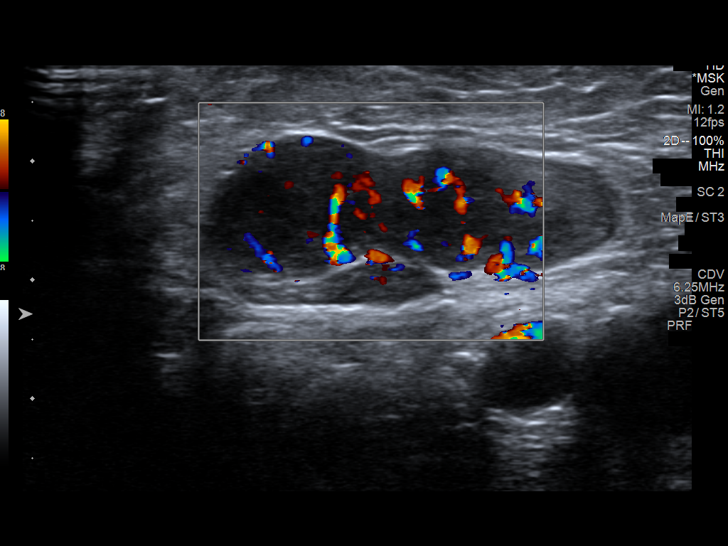
[im 3/15]
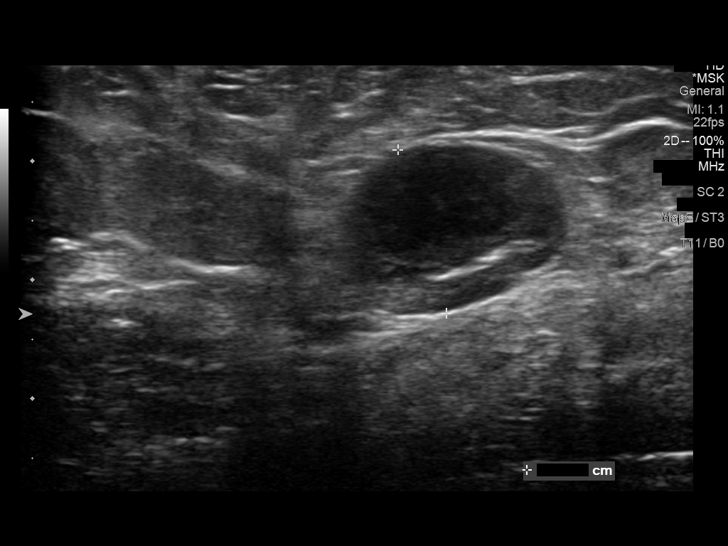
[im 4/15]
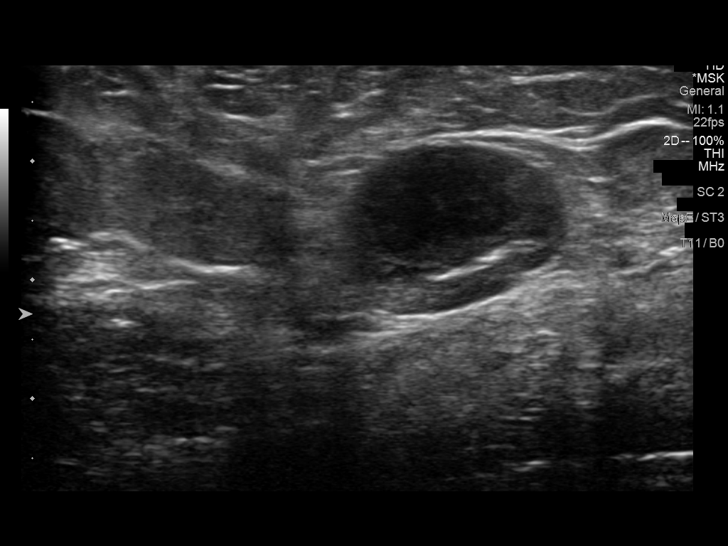
[im 5/15]
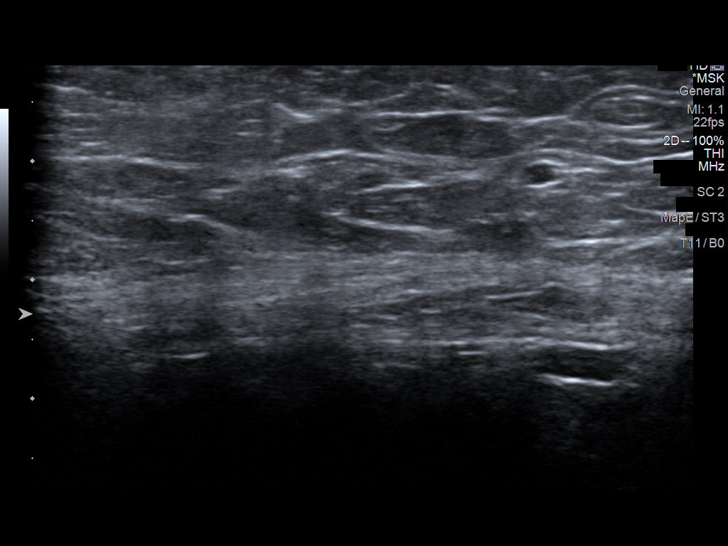
[im 6/15]
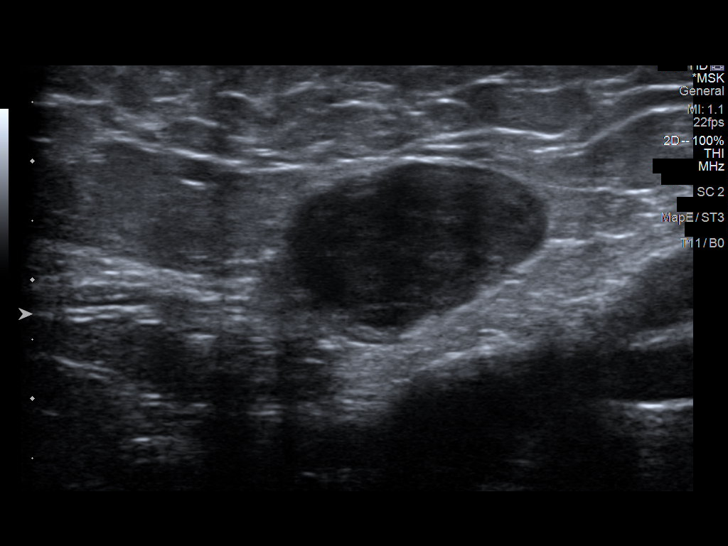
[im 7/15]
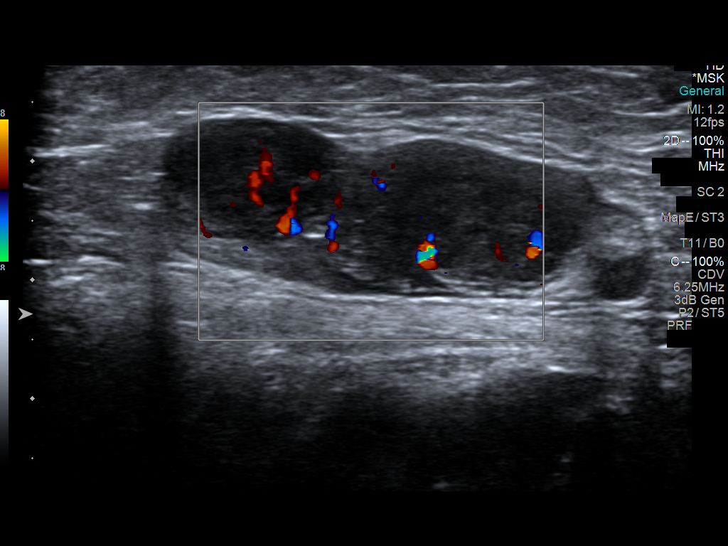
[im 9/15]
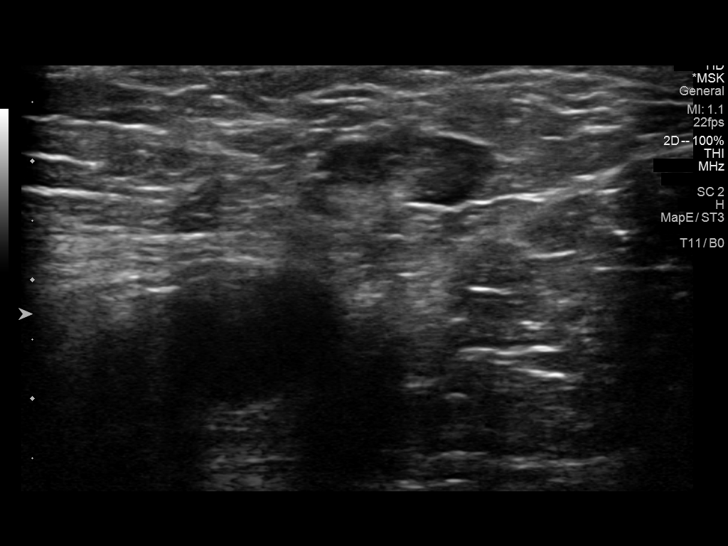
[im 10/15]
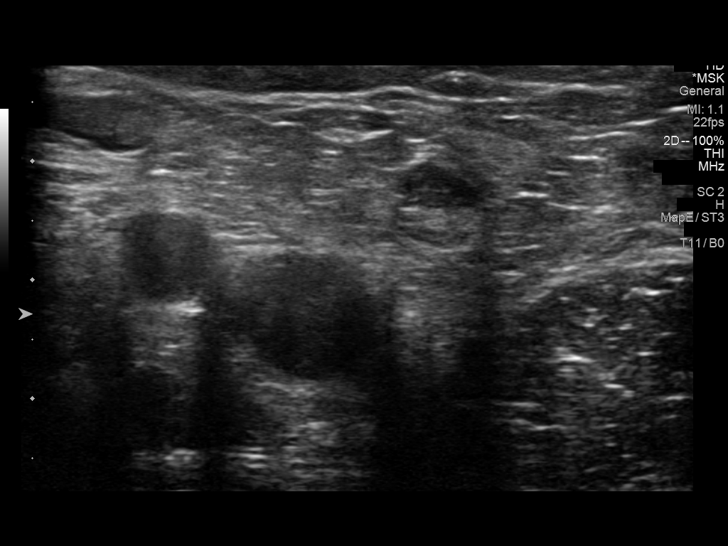
[im 11/15]
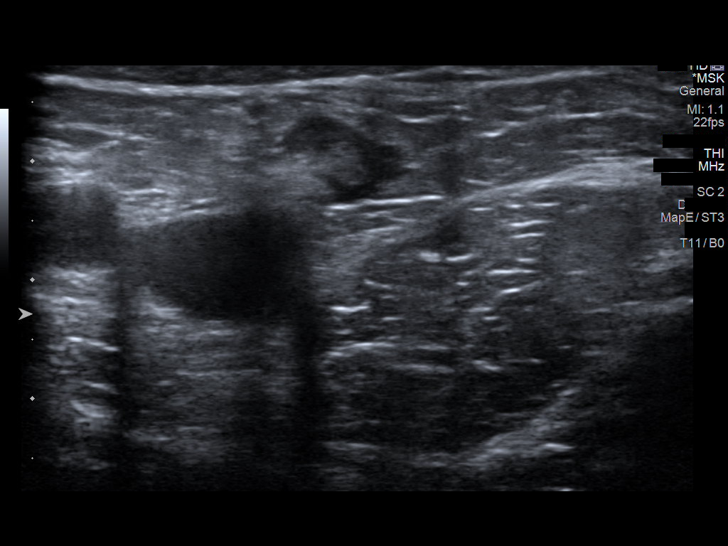
[im 12/15]
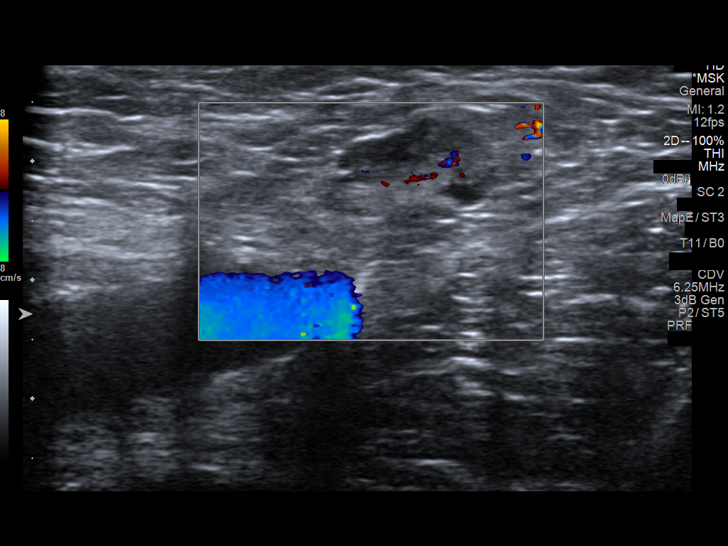
[im 13/15]
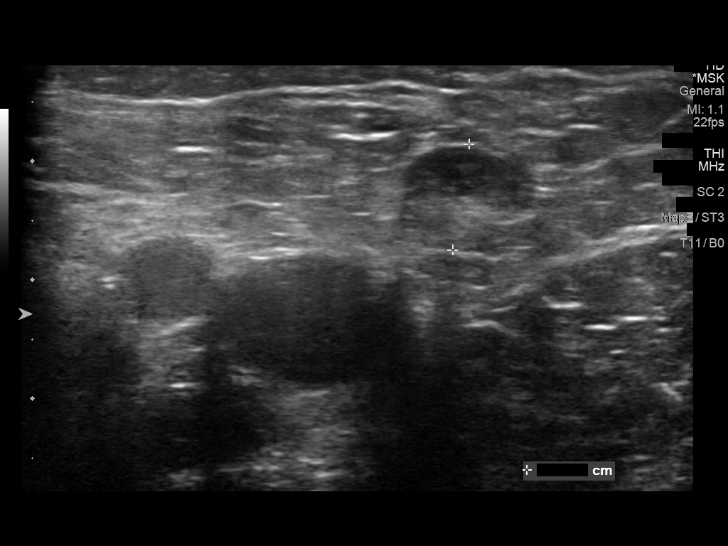
[im 14/15]
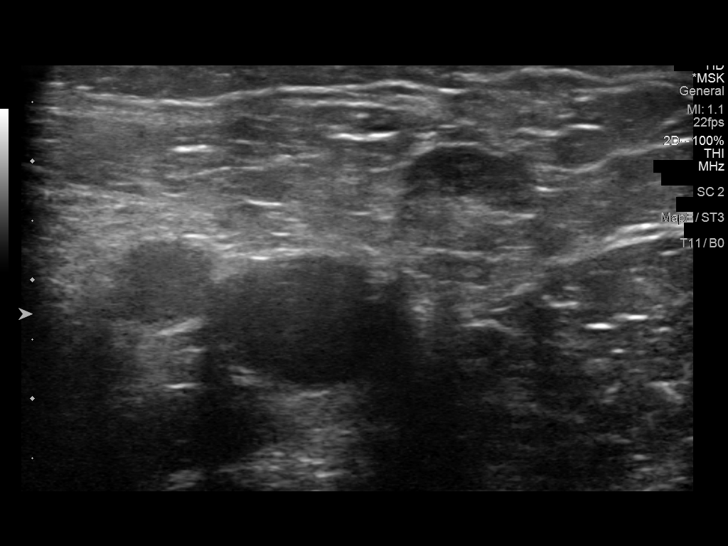
[im 15/15]
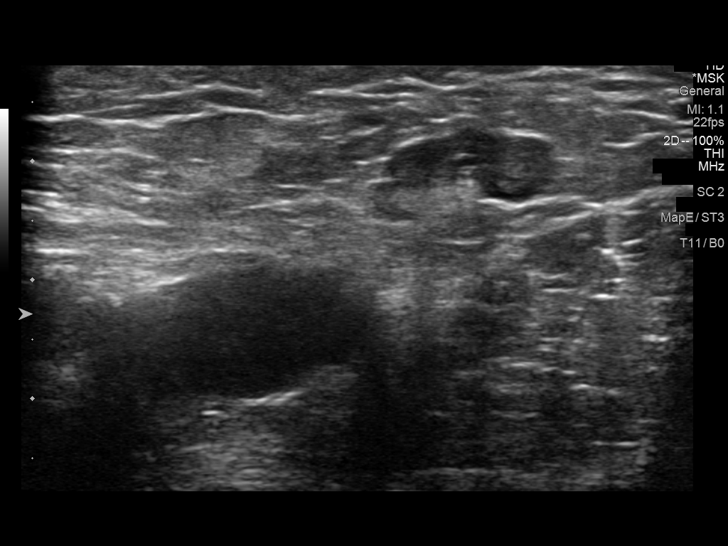

[14 of 15 positions shown; findings below may reference images not displayed]

FINDINGS: Enlarged prominent lymph node is noted in the left inguinal region.
This measures 3.5 x 1.2 x 1.4 cm and shows loss of the normal fatty
hilus.
IMPRESSION: Prominent left inguinal lymph node with loss of normal fatty hilus.
Although these changes may be reactive in nature the possibility of
neoplastic involvement deserves consideration. Correlation with
laboratory values is recommended. Additional imaging may be helpful
to evaluate for other sites of lymphadenopathy. Additionally tissue
sampling may be helpful.

These results will be called to the ordering clinician or
representative by the Radiologist Assistant, and communication
documented in the PACS or [REDACTED].

## 2020-07-12 ENCOUNTER — Other Ambulatory Visit: Payer: Self-pay | Admitting: Family Medicine

## 2020-07-12 DIAGNOSIS — R590 Localized enlarged lymph nodes: Secondary | ICD-10-CM

## 2020-07-21 ENCOUNTER — Other Ambulatory Visit: Payer: Self-pay

## 2020-07-21 ENCOUNTER — Ambulatory Visit
Admission: RE | Admit: 2020-07-21 | Discharge: 2020-07-21 | Disposition: A | Discharge status: Home / Self Care | Payer: No Typology Code available for payment source | Source: Ambulatory Visit | Attending: Family Medicine | Admitting: Family Medicine

## 2020-07-21 ENCOUNTER — Other Ambulatory Visit: Payer: No Typology Code available for payment source

## 2020-07-21 DIAGNOSIS — R590 Localized enlarged lymph nodes: Secondary | ICD-10-CM

## 2020-07-21 IMAGING — MR MR PELVIS W/O CM
4 of 5 series · 30 of 48 positions shown · non-contrast
Comparison: Pelvic ultrasound dated [DATE]

CLINICAL DATA: Persistent left inguinal lymphadenopathy

EXAM:
MRI ABDOMEN AND PELVIS WITHOUT CONTRAST
TECHNIQUE: Multiplanar multisequence MR imaging of the abdomen and pelvis was
performed. No intravenous contrast was administered.

[Series 4: T2 fat-sat · sagittal · 4.0mm · 0.51mm/px · 8 of 64 slices shown]
[im 1/64]
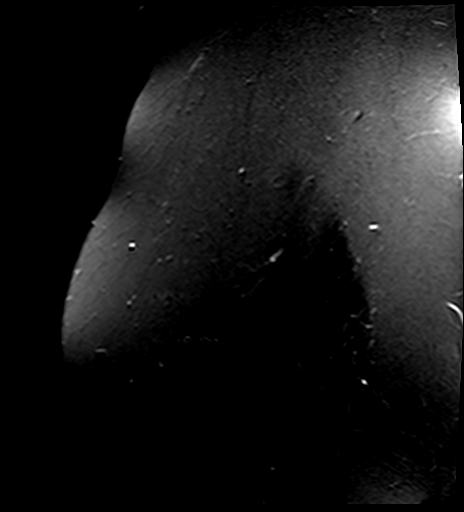
[im 10/64]
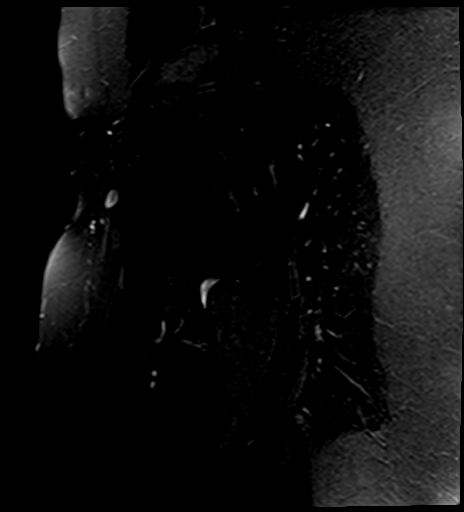
[im 20/64]
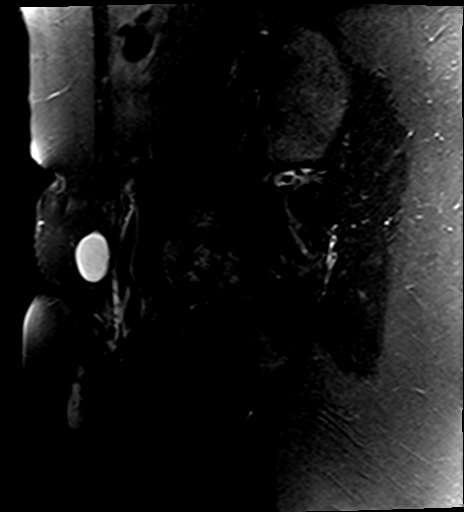
[im 30/64]
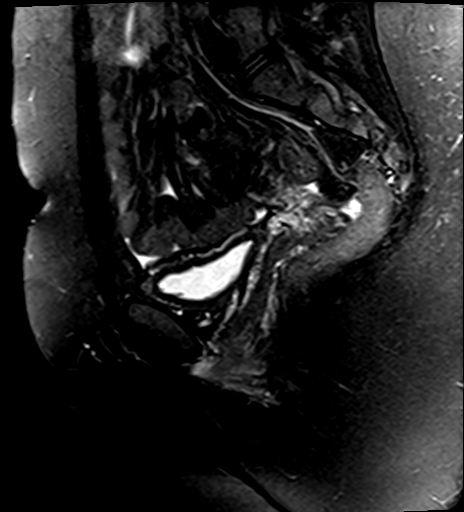
[im 34/64]
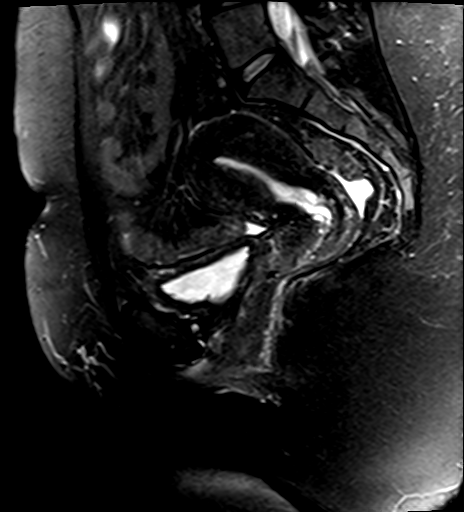
[im 44/64]
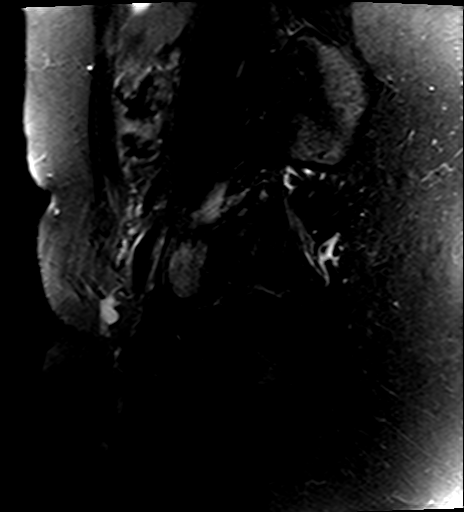
[im 54/64]
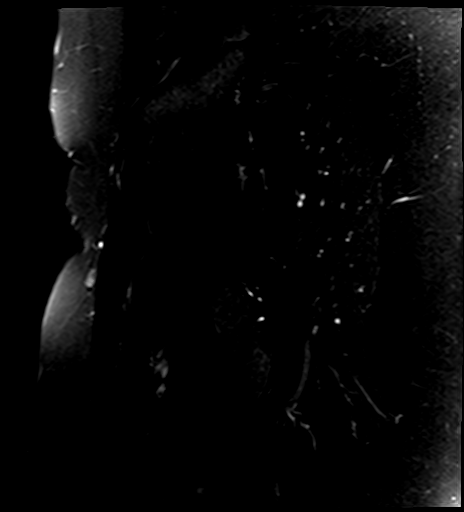
[im 64/64]
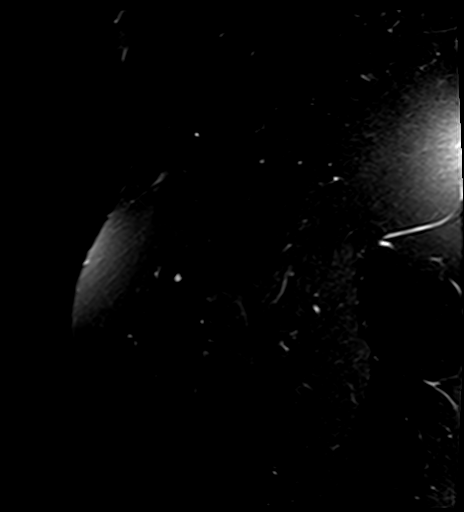

[Series 5: T1 · coronal · 4.0mm · 1.56mm/px · 8 of 40 slices shown (1 of 2)]
[im 1/40]
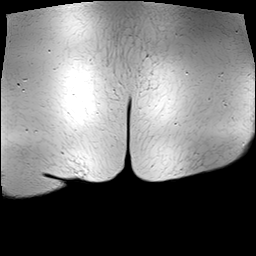
[im 6/40]
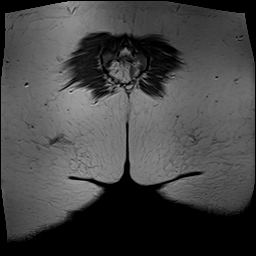
[im 12/40]
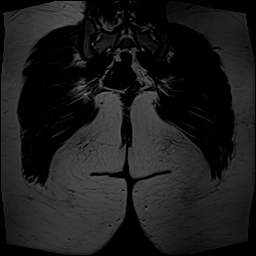
[im 17/40]
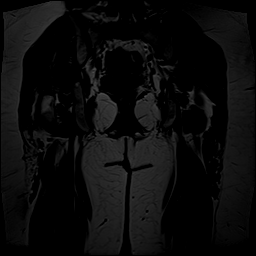
[im 23/40]
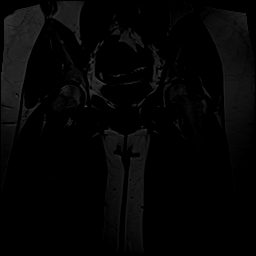
[im 28/40]
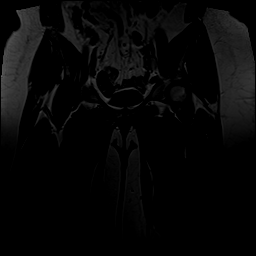
[im 34/40]
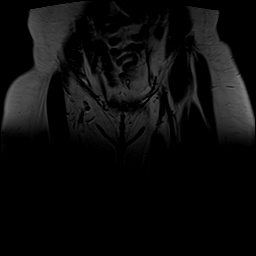
[im 40/40]
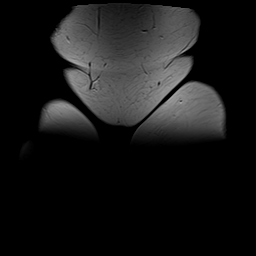

[Series 6: STIR · coronal · 4.0mm · 1.56mm/px · 8 of 40 slices shown]
[im 1/40]
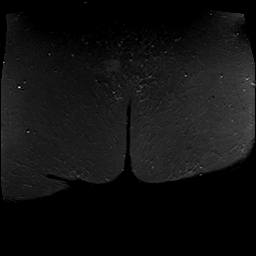
[im 6/40]
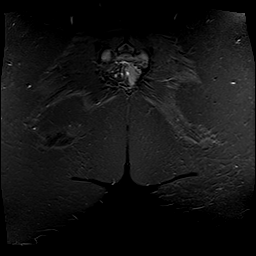
[im 12/40]
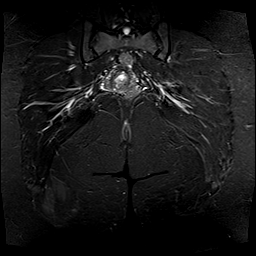
[im 17/40]
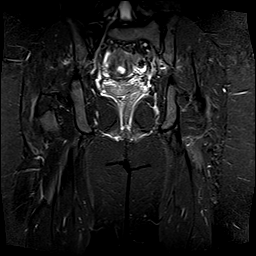
[im 23/40]
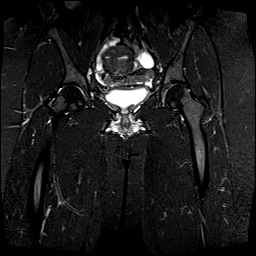
[im 28/40]
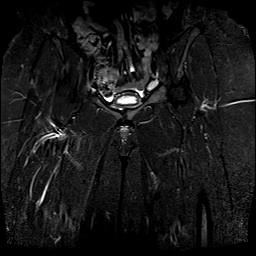
[im 34/40]
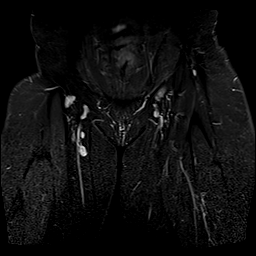
[im 40/40]
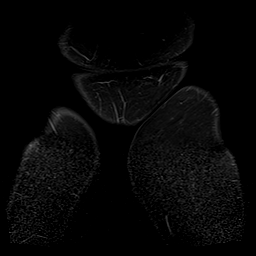

[Series 7: T1 · axial · 4.0mm · 0.68mm/px · z∈[-218,-33]mm · 6 of 44 slices shown (2 of 2)]
[im 1/44]
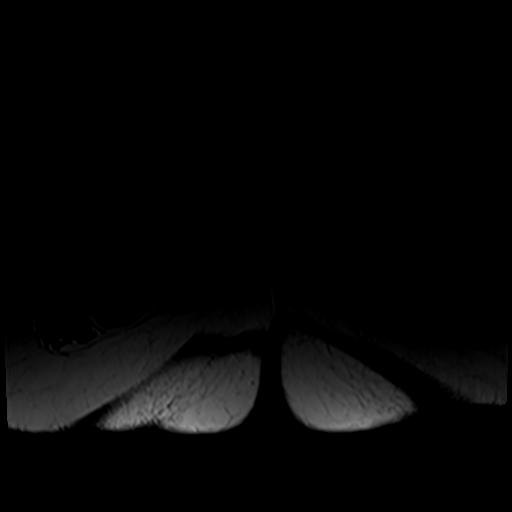
[im 6/44]
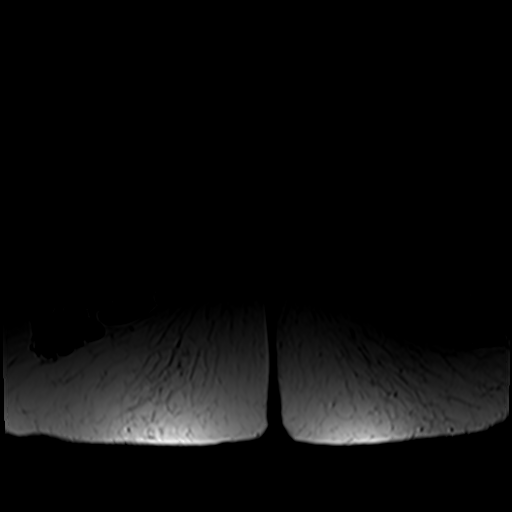
[im 11/44]
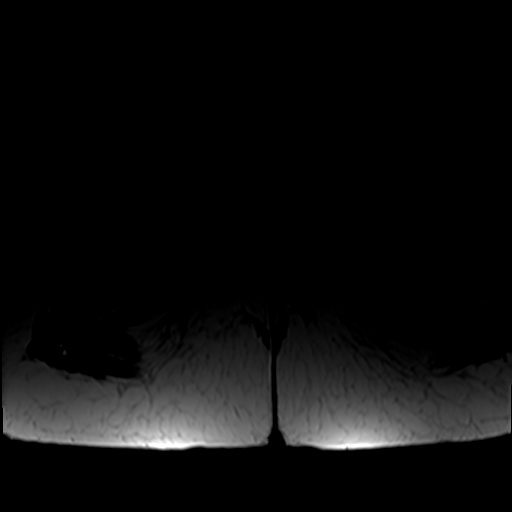
[im 17/44]
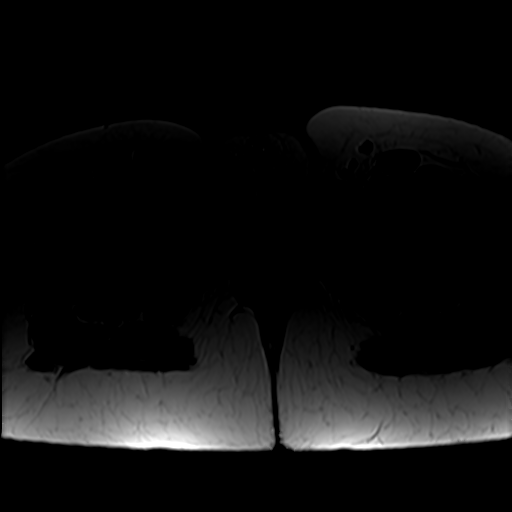
[im 22/44]
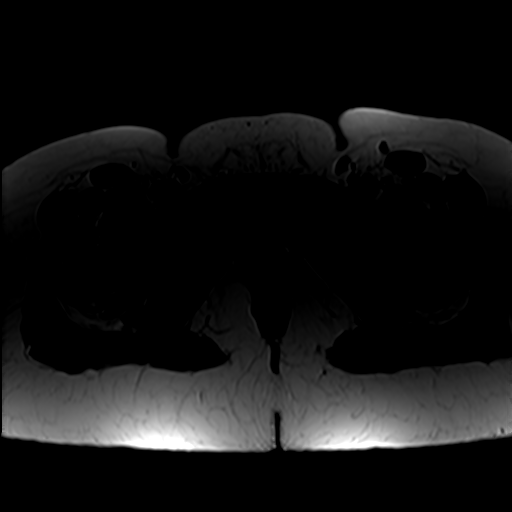
[im 38/44]
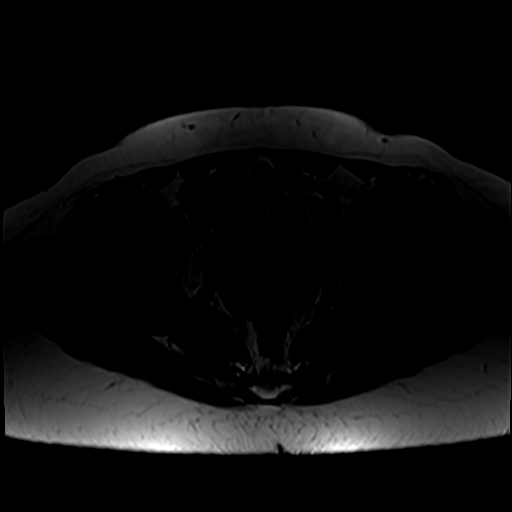

[30 of 48 positions shown; findings below may reference images not displayed]

FINDINGS: Lower chest: Lung bases are clear.

Hepatobiliary: 11 mm cyst in the central left liver (series 7/image
12). Liver is otherwise within normal limits. No hepatic steatosis.

Layering gallbladder sludge. No gallstones, gallbladder wall
thickening, or pericholecystic inflammatory changes. No intrahepatic
or extrahepatic ductal dilatation.

Pancreas:  Within normal limits.

Spleen:  Within normal limits.

Adrenals/Urinary Tract:  Adrenal glands are within normal limits.

Kidneys are within normal limits.  No hydronephrosis.

Stomach/Bowel: Stomach is within normal limits.

Visualized bowel is poorly evaluated but grossly unremarkable.

Vascular/Lymphatic:  No evidence of abdominal aortic aneurysm.

14 mm short axis left deep inguinal node (series 8/image 16). 15 mm
short axis left inguinal node (series 8/image 16).

No suspicious upper abdominal or retroperitoneal lymphadenopathy.

Reproductive: Suspected small uterine fibroids (series 6/image 22).

Bilateral ovaries are within normal limits, noting a 2.3 cm left
corpus luteum (series 8/image 7), physiologic.

Other:  Small volume pelvic ascites, physiologic.

Musculoskeletal: No focal osseous lesions.
IMPRESSION: Mild left inguinal and deep inguinal lymphadenopathy. Although
nonspecific, given persistence, indolent lymphoproliferative
disorder is not excluded. Consider tissue sampling/surgical excision
of the inguinal node.

No upper abdominal/retroperitoneal lymphadenopathy. Spleen is normal
in size.

## 2020-07-21 IMAGING — MR MR ABDOMEN W/O CM
4 of 7 series · 24 of 48 positions shown · non-contrast
Comparison: Pelvic ultrasound dated [DATE]

CLINICAL DATA: Persistent left inguinal lymphadenopathy

EXAM:
MRI ABDOMEN AND PELVIS WITHOUT CONTRAST
TECHNIQUE: Multiplanar multisequence MR imaging of the abdomen and pelvis was
performed. No intravenous contrast was administered.

[Series 3: cor haste · coronal · 5.0mm · 0.78mm/px · 3 of 31 slices shown]
[im 1/31]
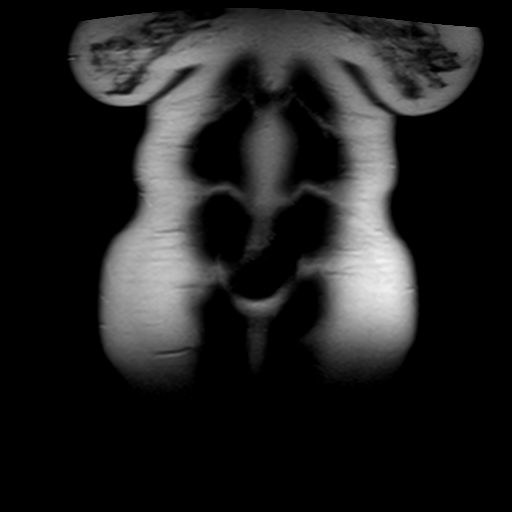
[im 16/31]
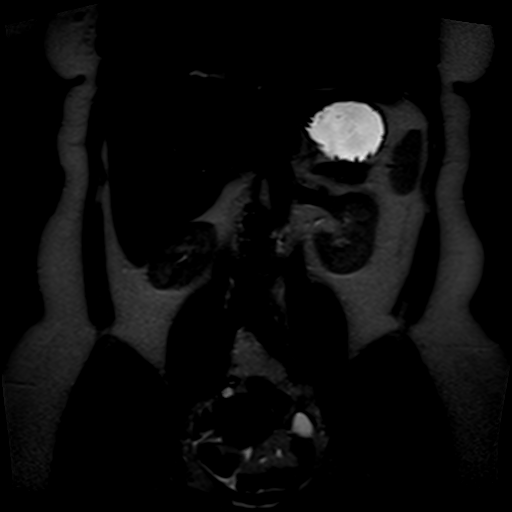
[im 31/31]
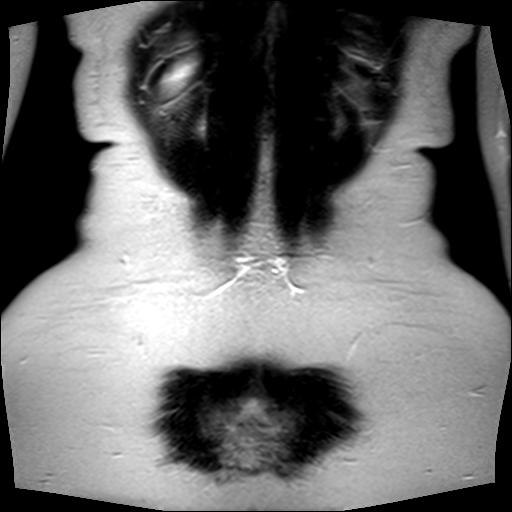

[Series 4: axial haste · axial · 6.0mm · 0.74mm/px · z∈[-105,+132]mm · 5 of 37 slices shown]
[im 1/37]
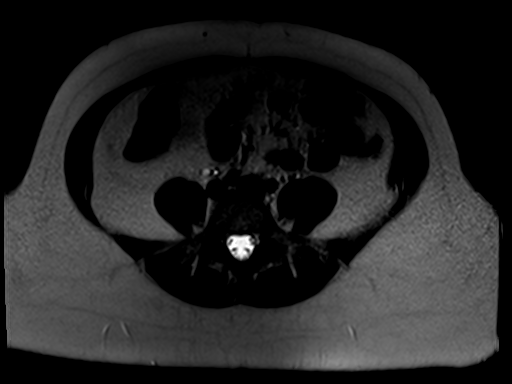
[im 10/37]
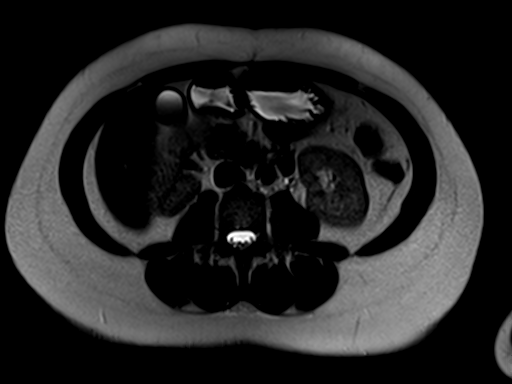
[im 19/37]
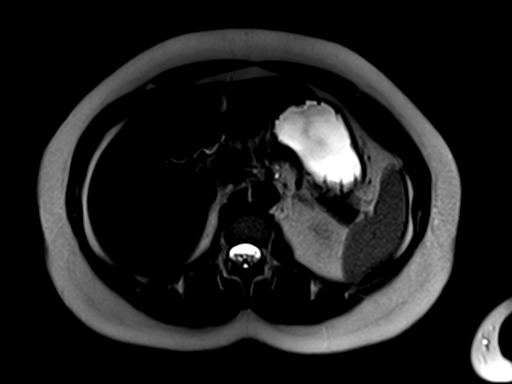
[im 28/37]
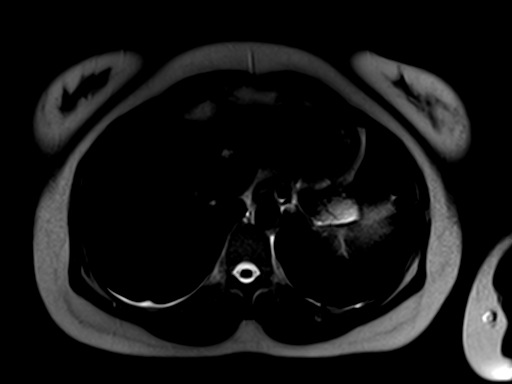
[im 37/37]
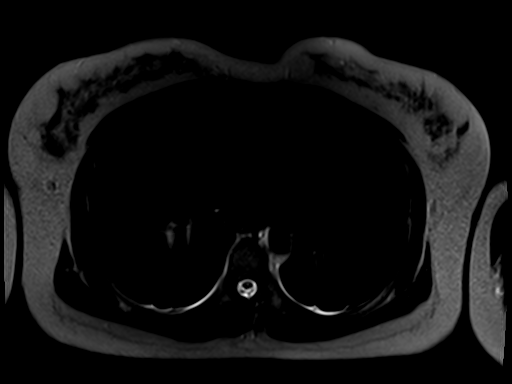

[Series 5: T1 · axial · 6.0mm · 0.76mm/px · z∈[-105,+132]mm · 9 of 74 slices shown]
[im 1/74]
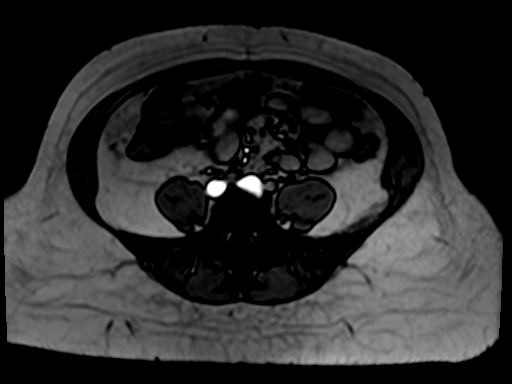
[im 10/74]
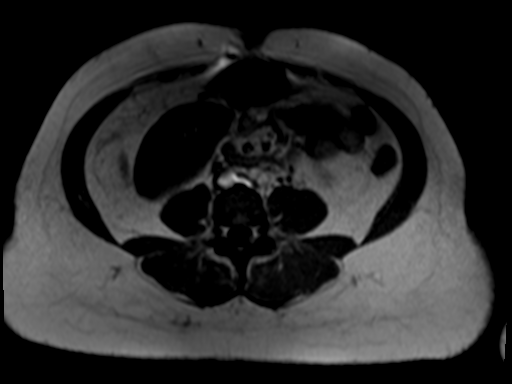
[im 19/74]
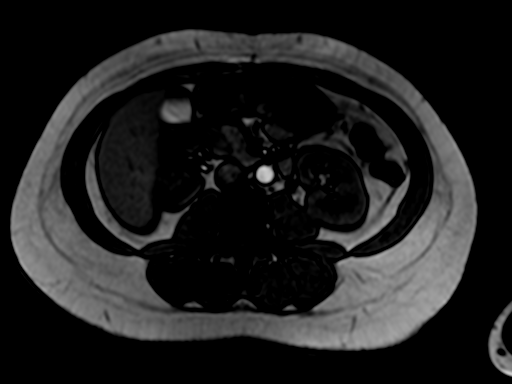
[im 28/74]
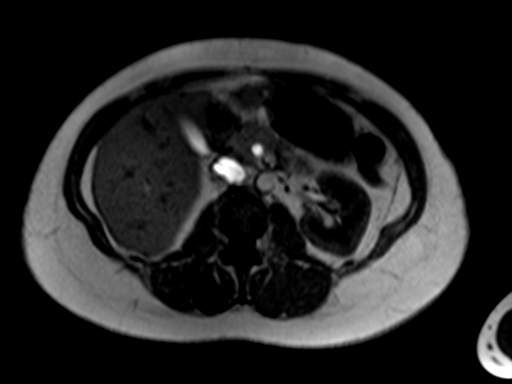
[im 37/74]
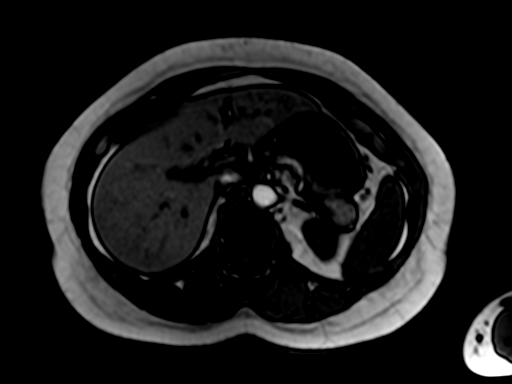
[im 46/74]
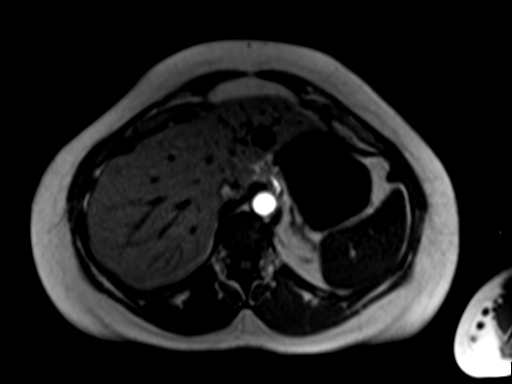
[im 55/74]
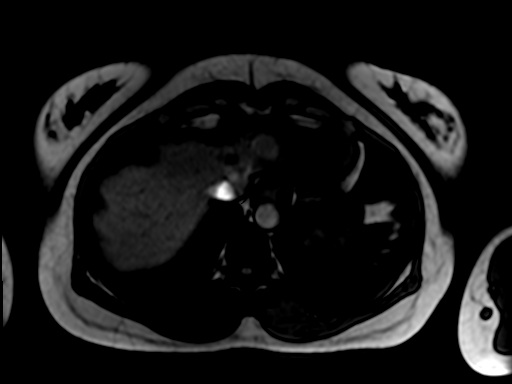
[im 64/74]
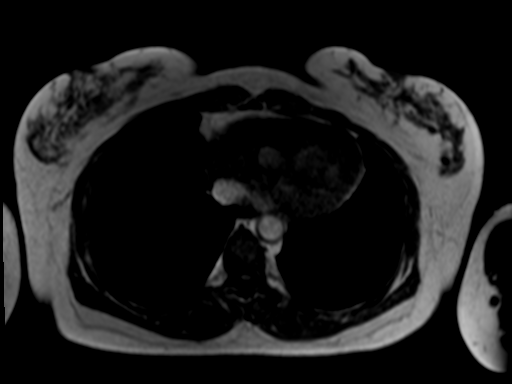
[im 74/74]
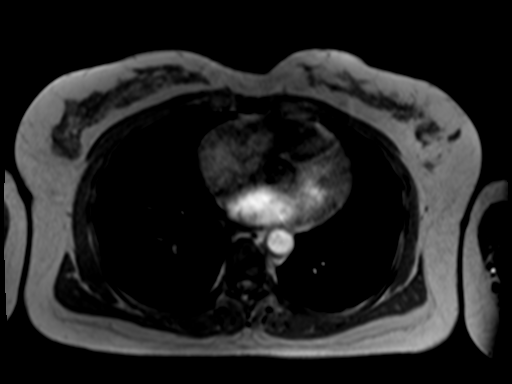

[Series 6: bSSFP · axial · 4.0mm · 0.74mm/px · z∈[-110,+101]mm · 7 of 63 slices shown]
[im 1/63]
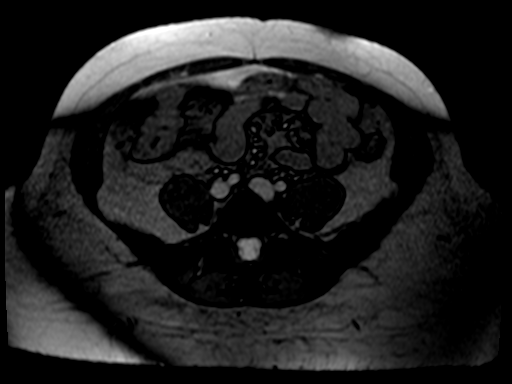
[im 9/63]
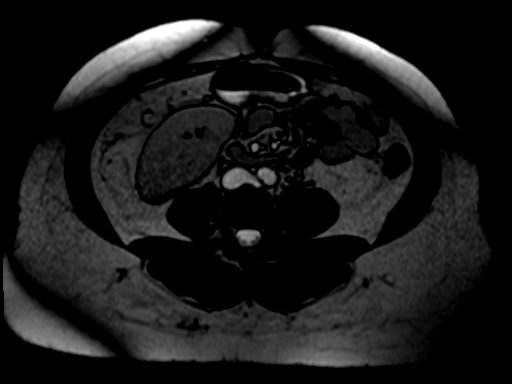
[im 18/63]
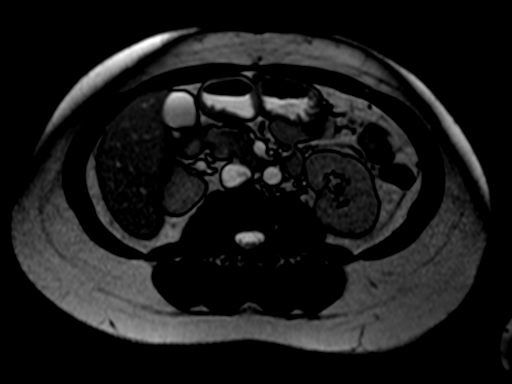
[im 27/63]
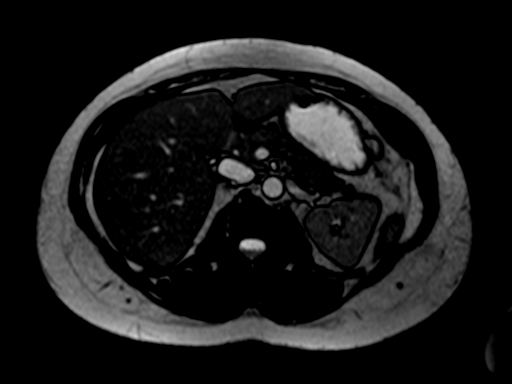
[im 36/63]
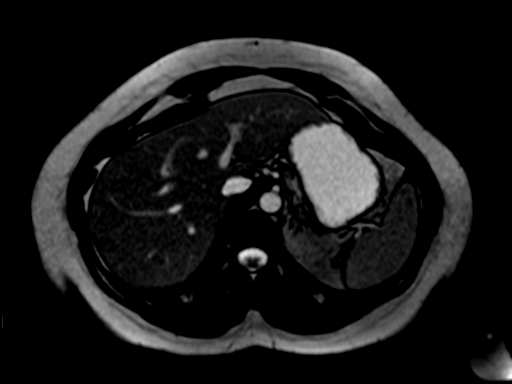
[im 45/63]
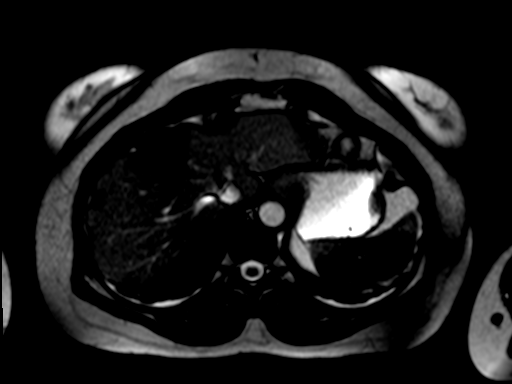
[im 54/63]
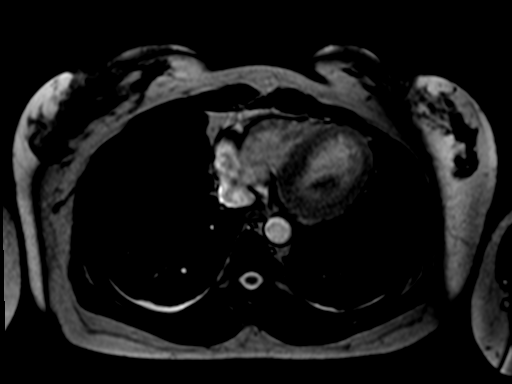

[24 of 48 positions shown; findings below may reference images not displayed]

FINDINGS: Lower chest: Lung bases are clear.

Hepatobiliary: 11 mm cyst in the central left liver (series 7/image
12). Liver is otherwise within normal limits. No hepatic steatosis.

Layering gallbladder sludge. No gallstones, gallbladder wall
thickening, or pericholecystic inflammatory changes. No intrahepatic
or extrahepatic ductal dilatation.

Pancreas:  Within normal limits.

Spleen:  Within normal limits.

Adrenals/Urinary Tract:  Adrenal glands are within normal limits.

Kidneys are within normal limits.  No hydronephrosis.

Stomach/Bowel: Stomach is within normal limits.

Visualized bowel is poorly evaluated but grossly unremarkable.

Vascular/Lymphatic:  No evidence of abdominal aortic aneurysm.

14 mm short axis left deep inguinal node (series 8/image 16). 15 mm
short axis left inguinal node (series 8/image 16).

No suspicious upper abdominal or retroperitoneal lymphadenopathy.

Reproductive: Suspected small uterine fibroids (series 6/image 22).

Bilateral ovaries are within normal limits, noting a 2.3 cm left
corpus luteum (series 8/image 7), physiologic.

Other:  Small volume pelvic ascites, physiologic.

Musculoskeletal: No focal osseous lesions.
IMPRESSION: Mild left inguinal and deep inguinal lymphadenopathy. Although
nonspecific, given persistence, indolent lymphoproliferative
disorder is not excluded. Consider tissue sampling/surgical excision
of the inguinal node.

No upper abdominal/retroperitoneal lymphadenopathy. Spleen is normal
in size.

## 2020-08-08 ENCOUNTER — Telehealth: Payer: Self-pay | Admitting: Hematology

## 2020-08-08 NOTE — Telephone Encounter (Signed)
Received a new pt referral from Dr. Pecola Leisure for Inguinal lymphadenopathy. Beverly Hobbs has been cld and scheduled to see Dr. Candise Che on 7/18 at 1pm.

## 2020-08-12 NOTE — Progress Notes (Signed)
HEMATOLOGY/ONCOLOGY CONSULTATION NOTE  Date of Service: 08/13/2020  Patient Care Team: Leilani Able, MD as PCP - General (Family Medicine)  CHIEF COMPLAINTS/PURPOSE OF CONSULTATION:  Inguinal Lymphadenopathy  HISTORY OF PRESENTING ILLNESS:   Beverly Hobbs is a wonderful 45 y.o. female who has been referred to [place] by Dr. Leilani Able, MD for evaluation and management of inguinal lymphadenopathy. The pt reports that she is doing well overall.  The pt reports that she noticed the inguinal lymph nodes first back in March. She was shaving in the shower and noted the lymph node first incidentally. She then gave it a couple weeks to see if it resolved, but did not. She denies any stubbed toes or infections. She notes a history of eczema and one recent instance of recurrence, but this was mild and resolved since. These lymph nodes seemed to continue to increase in size and were non-tender. This is what the workup for her [place] and MRI were. The pt notes she has Adenomyosis and they did an [place] in January for this. They had seen a fibroid on that, but no other issues. She denies any signs of infections or any boils. She has not had any abnormal vaginal discharge. She notes the adenomyosis causes intermittent heavy bleeding, but has been stable recently.   In the last six months, the pt notes some pelvic heaviness and a pressure on the sign of the lymph nodes. She denies any pain or hurt upon touching the lymph nodes. She denies any history of hemorrhoids or changes in bowel habits. The pt has not noted any other lumps or bumps. She gets very winded after light exertion, which is new for her as well. The pt was recently started on Integra. She notes this is a combination of fatigue and SOB. It has improved since starting Integra around one month ago. Prior to this, she was even too fatigued to fully shower without rest. She notes a history of childhood asthma and hypertension. The pt notes she is back on all  the meds for her hypertensive crisis and was improving down to just one medicine recently, until another bad episode around three weeks ago. She has a history of   The pt notes a dry cough that has started since the pandemic. 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  The pt notes only rare social alcohol intake, denies h/o smoking or drug use. She had adverse reactions to hydrocodone. She is on Topamax for her migraines.  Her periods have been very heavy, lasting five days and filling pads every two hours. This fluctuates, as the last month was very light prior to the most recent heavy cycle.  The patient notes the size of the lymph node is still the size of a marble, but very persistent. The patient notes she received her first booster in late November. She has not gotten the second booster yet due to her current symptoms. She denies any concern for blood product exposure. The patient notes she rarely gets sick and has a very strong immune system. She denies any familial history of autoimmune conditions or sarcoidosis.  The patient notes she recently had a mammogram within the last year. This noted no concerns.  The pt has had MR Pelvis ([id number]) on 07/21/2020, which revealed "Mild left inguinal and deep inguinal lymphadenopathy. Although nonspecific, given persistence, indolent lymphoproliferative disorder is not excluded. Consider tissue sampling/surgical excision of  the inguinal node."  The pt has had MR Abdomen ([id number]) on 07/21/2020.  The pt has had US Pelvis Limited (Transabdominal only) on 07/06/2020, which revealed "Prominent left inguinal lymph node with loss of normal fatty hilus. Although these changes may be reactive in nature the possibility of neoplastic involvement deserves consideration. Correlation with laboratory values is recommended. Additional imaging may be helpful to evaluate for other sites  of lymphadenopathy. Additionally tissue sampling may be helpful."  Lab results 06/19/2020 of CBC w/diff  is as follows: all values are WNL except for Hgb of 10.0, HCT of 33.1, MCV of 78.3, MCH of 23.6, MCHC of 30.2, RDW of 171, MPV of 13.4.  On review of systems, pt reports inguinal lymph nodes, fatigue, SOB, dry cough and denies chest pain, productive cough, changes in bowel habits, fevers, chills, night sweats, sudden weight loss, and any other symptoms.   MEDICAL HISTORY:  Past Medical History:  Diagnosis Date   Anemia    H/O   Asthma    AS A CHILD   HSV-1 infection    H/O   Hypercholesterolemia    Hypertension    SAB (spontaneous abortion)    H/O    SURGICAL HISTORY: Past Surgical History:  Procedure Laterality Date   ADENOIDECTOMY     TONSILLECTOMY  2001    SOCIAL HISTORY: Social History   Socioeconomic History   Marital status: Married    Spouse name: Not on file   Number of children: 1   Years of education: MS RN   Highest education level: Not on file  Occupational History    Comment: RN, [employer]  Tobacco Use   Smoking status: Never   Smokeless tobacco: Never  Substance and Sexual Activity   Alcohol use: No   Drug use: No   Sexual activity: Yes    Birth control/protection: Other-see comments  Other Topics Concern   Not on file  Social History Narrative   Lives with husband, child   Caffeine- none   Social Determinants of Health   Financial Resource Strain: Not on file  Food Insecurity: Not on file  Transportation Needs: Not on file  Physical Activity: Not on file  Stress: Not on file  Social Connections: Not on file  Intimate Partner Violence: Not on file    FAMILY HISTORY: Family History  Problem Relation Age of Onset   Hypertension Mother    Bipolar disorder Mother    Fibromyalgia Mother    Heart disease Father    Hypertension Father    CVA Father        age 46   Hypertension Sister    Heart disease Sister    Fibroids Sister     Heart attack Maternal Grandmother    Heart disease Maternal Grandmother    Hypertension Paternal Grandmother    Heart disease Paternal Grandmother    Thyroid disease Paternal Grandmother    Heart disease Paternal Grandfather    Heart attack Paternal Grandfather     ALLERGIES:  is allergic to codeine and hydrocodone-acetaminophen.  MEDICATIONS:  Current Outpatient Medications  Medication Sig Dispense Refill   amLODipine (NORVASC) 10 MG tablet Take 10 mg by mouth daily.     cloNIDine (CATAPRES) 0.2 MG tablet Take 0.2 mg by mouth as needed.      labetalol (NORMODYNE) 100 MG tablet Take 1 tablet (100 mg total) by mouth 2 (two) times daily. 60 tablet 0   topiramate (TOPAMAX) 50 MG tablet Take 1 tablet (50 mg total)  by mouth 2 (two) times daily. 60 tablet 12   atorvastatin (LIPITOR) 20 MG tablet Take 20 mg by mouth daily.     diazepam (VALIUM) 5 MG tablet Take 5 mg by mouth every 12 (twelve) hours as needed for anxiety. (Patient not taking: Reported on 08/13/2020)     spironolactone (ALDACTONE) 25 MG tablet Take 25 mg by mouth 2 (two) times daily. (Patient not taking: Reported on 08/13/2020)     No current facility-administered medications for this visit.    REVIEW OF SYSTEMS:    10 Point review of Systems was done is negative except as noted above.  PHYSICAL EXAMINATION: ECOG PERFORMANCE STATUS: 1 - Symptomatic but completely ambulatory  . Vitals:   08/13/20 1256  BP: 139/81  Pulse: 93  Resp: 17  Temp: 98.5 F (36.9 C)  SpO2: 100%   Filed Weights   08/13/20 1256  Weight: 205 lb 8 oz (93.2 kg)   .Body mass index is 32.67 kg/m.   GENERAL:alert, in no acute distress and comfortable SKIN: no acute rashes, no significant lesions EYES: conjunctiva are pink and non-injected, sclera anicteric OROPHARYNX: MMM, no exudates, no oropharyngeal erythema or ulceration NECK: supple, no JVD LYMPH:  no palpable lymphadenopathy in the cervical, axillary regions. Palpable lymph nodes in  inguinal regions. LUNGS: clear to auscultation b/l with normal respiratory effort HEART: regular rate & rhythm ABDOMEN:  normoactive bowel sounds , non tender, not distended. Extremity: no pedal edema PSYCH: alert & oriented x 3 with fluent speech NEURO: no focal motor/sensory deficits  LABORATORY DATA:  I have reviewed the data as listed  . CBC Latest Ref Rng & Units 05/28/2017 03/17/2016  WBC 4.0 - 10.5 K/uL 7.7 8.9  Hemoglobin 12.0 - 15.0 g/dL 10.7(L) 10.7(L)  Hematocrit 36.0 - 46.0 % 32.9(L) 33.4(L)  Platelets 150 - 400 K/uL 348 268    . CMP Latest Ref Rng & Units 05/28/2017 03/17/2016  Glucose 65 - 99 mg/dL 98 92  BUN 6 - 20 mg/dL 10 12  Creatinine 6.23 - 1.00 mg/dL 7.62 8.31  Sodium 517 - 145 mmol/L 139 136  Potassium 3.5 - 5.1 mmol/L 4.2 4.1  Chloride 101 - 111 mmol/L 102 98(L)  CO2 22 - 32 mmol/L 26 26  Calcium 8.9 - 10.3 mg/dL 9.9 9.4  Total Protein 6.5 - 8.1 g/dL - 7.8  Total Bilirubin 0.3 - 1.2 mg/dL - 0.5  Alkaline Phos 38 - 126 U/L - 61  AST 15 - 41 U/L - 18  ALT 14 - 54 U/L - 14     RADIOGRAPHIC STUDIES: I have personally reviewed the radiological images as listed and agreed with the findings in the report. MR PELVIS WO CONTRAST  Result Date: 07/23/2020 CLINICAL DATA:  Persistent left inguinal lymphadenopathy EXAM: MRI ABDOMEN AND PELVIS WITHOUT CONTRAST TECHNIQUE: Multiplanar multisequence MR imaging of the abdomen and pelvis was performed. No intravenous contrast was administered. COMPARISON:  Pelvic ultrasound dated 07/06/2020 FINDINGS: Lower chest: Lung bases are clear. Hepatobiliary: 11 mm cyst in the central left liver (series 7/image 12). Liver is otherwise within normal limits. No hepatic steatosis. Layering gallbladder sludge. No gallstones, gallbladder wall thickening, or pericholecystic inflammatory changes. No intrahepatic or extrahepatic ductal dilatation. Pancreas:  Within normal limits. Spleen:  Within normal limits. Adrenals/Urinary Tract:  Adrenal  glands are within normal limits. Kidneys are within normal limits.  No hydronephrosis. Stomach/Bowel: Stomach is within normal limits. Visualized bowel is poorly evaluated but grossly unremarkable. Vascular/Lymphatic:  No evidence of abdominal aortic aneurysm.  14 mm short axis left deep inguinal node (series 8/image 16). 15 mm short axis left inguinal node (series 8/image 16). No suspicious upper abdominal or retroperitoneal lymphadenopathy. Reproductive: Suspected small uterine fibroids (series 6/image 22). Bilateral ovaries are within normal limits, noting a 2.3 cm left corpus luteum (series 8/image 7), physiologic. Other:  Small volume pelvic ascites, physiologic. Musculoskeletal: No focal osseous lesions. IMPRESSION: Mild left inguinal and deep inguinal lymphadenopathy. Although nonspecific, given persistence, indolent lymphoproliferative disorder is not excluded. Consider tissue sampling/surgical excision of the inguinal node. No upper abdominal/retroperitoneal lymphadenopathy. Spleen is normal in size. Electronically Signed   By: Charline Bills M.D.   On: 07/23/2020 09:32   MR ABDOMEN WO CONTRAST  Result Date: 07/23/2020 CLINICAL DATA:  Persistent left inguinal lymphadenopathy EXAM: MRI ABDOMEN AND PELVIS WITHOUT CONTRAST TECHNIQUE: Multiplanar multisequence MR imaging of the abdomen and pelvis was performed. No intravenous contrast was administered. COMPARISON:  Pelvic ultrasound dated 07/06/2020 FINDINGS: Lower chest: Lung bases are clear. Hepatobiliary: 11 mm cyst in the central left liver (series 7/image 12). Liver is otherwise within normal limits. No hepatic steatosis. Layering gallbladder sludge. No gallstones, gallbladder wall thickening, or pericholecystic inflammatory changes. No intrahepatic or extrahepatic ductal dilatation. Pancreas:  Within normal limits. Spleen:  Within normal limits. Adrenals/Urinary Tract:  Adrenal glands are within normal limits. Kidneys are within normal limits.  No  hydronephrosis. Stomach/Bowel: Stomach is within normal limits. Visualized bowel is poorly evaluated but grossly unremarkable. Vascular/Lymphatic:  No evidence of abdominal aortic aneurysm. 14 mm short axis left deep inguinal node (series 8/image 16). 15 mm short axis left inguinal node (series 8/image 16). No suspicious upper abdominal or retroperitoneal lymphadenopathy. Reproductive: Suspected small uterine fibroids (series 6/image 22). Bilateral ovaries are within normal limits, noting a 2.3 cm left corpus luteum (series 8/image 7), physiologic. Other:  Small volume pelvic ascites, physiologic. Musculoskeletal: No focal osseous lesions. IMPRESSION: Mild left inguinal and deep inguinal lymphadenopathy. Although nonspecific, given persistence, indolent lymphoproliferative disorder is not excluded. Consider tissue sampling/surgical excision of the inguinal node. No upper abdominal/retroperitoneal lymphadenopathy. Spleen is normal in size. Electronically Signed   By: Charline Bills M.D.   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 The prior US had shown one larger lymph node. -Discussed biopsying the lymph node. Advised pt this would be the next step. -Discussed pt's anemia. Advised pt that it is most likely IDA due to heavy ongoing menstrual losses. The pt notes a h/o of anemia since she was a child. -Advised pt the timeline is reassuring, in that a fast growing disease would have doubled the size of the lymph node by now. -Discussed dermatopathic lymphadenopathy that is causing the lymph nodes due to pt's eczema. -Advised pt that any axillary lymph nodes would have been picked up on the mammogram she had. -Advised pt that we need at least a core needle biopsy of this type of lymph node. This would help to pick up on indolent lymphomas. Or, we could get a surgical  biopsy.  -Discussed pros and cons of core needle versus surgical biopsies. The patient prefers to get a core needle biopsy. -Will hold off on PET scan until after biopsy received. -Will get labwork today. -Will get Core Needle Biopsy Inguinal Lymph node in 3-7 days. -Will see back via phone in 2 weeks.   FOLLOW UP:  Labs today US guided core needle biopsy of left inguinal LN in 3 days Phone visit with Dr Candise Che in 2 weeks   All of the patients questions were answered with apparent satisfaction. The patient knows to call the clinic with any problems, questions or concerns.  I spent 46 minutes counseling the patient face to face. The total time spent in the appointment was 60 minutes and more than 50% was on counseling and direct patient cares.    Wyvonnia Lora MD MS AAHIVMS [facility] Hematology/Oncology Physician [facility]  (Office):       [phone] (Work cell):  [phone] (Fax):           [phone]  08/13/2020 2:08 PM  I, Minda Meo, am acting as scribe for Dr. Wyvonnia Lora, MD.   .I have reviewed the above documentation for accuracy and completeness, and I agree with the above. Johney Maine MD

## 2020-08-13 ENCOUNTER — Other Ambulatory Visit: Payer: Self-pay

## 2020-08-13 ENCOUNTER — Inpatient Hospital Stay: Payer: No Typology Code available for payment source

## 2020-08-13 ENCOUNTER — Inpatient Hospital Stay: Payer: No Typology Code available for payment source | Attending: Hematology | Admitting: Hematology

## 2020-08-13 VITALS — BP 139/81 | HR 93 | Temp 98.5°F | Resp 17 | Ht 66.5 in | Wt 205.5 lb

## 2020-08-13 DIAGNOSIS — N800 Endometriosis of uterus: Secondary | ICD-10-CM

## 2020-08-13 DIAGNOSIS — Z79899 Other long term (current) drug therapy: Secondary | ICD-10-CM | POA: Insufficient documentation

## 2020-08-13 DIAGNOSIS — K7689 Other specified diseases of liver: Secondary | ICD-10-CM

## 2020-08-13 DIAGNOSIS — R590 Localized enlarged lymph nodes: Secondary | ICD-10-CM

## 2020-08-13 DIAGNOSIS — D649 Anemia, unspecified: Secondary | ICD-10-CM | POA: Insufficient documentation

## 2020-08-13 LAB — VITAMIN B12: Vitamin B-12: 220 pg/mL (ref 180–914)

## 2020-08-13 LAB — CBC WITH DIFFERENTIAL (CANCER CENTER ONLY)
Abs Immature Granulocytes: 0.01 10*3/uL (ref 0.00–0.07)
Basophils Absolute: 0 10*3/uL (ref 0.0–0.1)
Basophils Relative: 0 %
Eosinophils Absolute: 0.2 10*3/uL (ref 0.0–0.5)
Eosinophils Relative: 4 %
HCT: 31 % — ABNORMAL LOW (ref 36.0–46.0)
Hemoglobin: 9.8 g/dL — ABNORMAL LOW (ref 12.0–15.0)
Immature Granulocytes: 0 %
Lymphocytes Relative: 24 %
Lymphs Abs: 1.5 10*3/uL (ref 0.7–4.0)
MCH: 25.3 pg — ABNORMAL LOW (ref 26.0–34.0)
MCHC: 31.6 g/dL (ref 30.0–36.0)
MCV: 80.1 fL (ref 80.0–100.0)
Monocytes Absolute: 0.6 10*3/uL (ref 0.1–1.0)
Monocytes Relative: 9 %
Neutro Abs: 4 10*3/uL (ref 1.7–7.7)
Neutrophils Relative %: 63 %
Platelet Count: 314 10*3/uL (ref 150–400)
RBC: 3.87 MIL/uL (ref 3.87–5.11)
RDW: 17.8 % — ABNORMAL HIGH (ref 11.5–15.5)
WBC Count: 6.3 10*3/uL (ref 4.0–10.5)
nRBC: 0 % (ref 0.0–0.2)

## 2020-08-13 LAB — CMP (CANCER CENTER ONLY)
ALT: 16 U/L (ref 0–44)
AST: 17 U/L (ref 15–41)
Albumin: 4 g/dL (ref 3.5–5.0)
Alkaline Phosphatase: 61 U/L (ref 38–126)
Anion gap: 8 (ref 5–15)
BUN: 11 mg/dL (ref 6–20)
CO2: 28 mmol/L (ref 22–32)
Calcium: 9.3 mg/dL (ref 8.9–10.3)
Chloride: 105 mmol/L (ref 98–111)
Creatinine: 0.74 mg/dL (ref 0.44–1.00)
GFR, Estimated: >60 mL/min (ref >60)
Glucose, Bld: 80 mg/dL (ref 70–99)
Potassium: 3.8 mmol/L (ref 3.5–5.1)
Sodium: 141 mmol/L (ref 135–145)
Total Bilirubin: 0.2 mg/dL — ABNORMAL LOW (ref 0.3–1.2)
Total Protein: 7.7 g/dL (ref 6.5–8.1)

## 2020-08-13 LAB — LACTATE DEHYDROGENASE: LDH: 148 U/L (ref 98–192)

## 2020-08-13 LAB — IRON AND TIBC
Iron: 167 ug/dL — ABNORMAL HIGH (ref 41–142)
Saturation Ratios: 45 % (ref 21–57)
TIBC: 375 ug/dL (ref 236–444)
UIBC: 208 ug/dL (ref 120–384)

## 2020-08-13 LAB — HIV ANTIBODY (ROUTINE TESTING W REFLEX): HIV Screen 4th Generation wRfx: NONREACTIVE

## 2020-08-13 LAB — FERRITIN: Ferritin: 19 ng/mL (ref 11–307)

## 2020-08-13 LAB — HEPATITIS C ANTIBODY: HCV Ab: NONREACTIVE

## 2020-08-13 LAB — SEDIMENTATION RATE: Sed Rate: 7 mm/hr (ref 0–22)

## 2020-08-15 ENCOUNTER — Encounter (HOSPITAL_COMMUNITY): Payer: Self-pay

## 2020-08-15 NOTE — Progress Notes (Unsigned)
       Patient Demographics  Patient Name  Beverly Hobbs, Beverly Hobbs Legal Sex  Female DOB  11-12-1975 SSN  [id number] Address  [address]  [place] [address] Phone  [phone] [facility])  [phone] (Work)  [phone] (Mobile)     RE: Biopsy Received: Genevie Ann, Kandis Cocking, MD  Lind Covert [facility] Procedure Requests Approved for US guided core biopsy of enlarged LEFT inguinal LN.   HKM         Previous Messages    ----- Message -----  From: Cory Munch  Sent: 08/14/2020  11:25 AM EDT  To: Ir Procedure Requests  Subject: Biopsy                                         Procedure Requested:  US Biopsy (lymph nodes)    Reason for Procedure: persistent 3.5cm palpable left inguinal lymphadenopathy for lymphoma workup    Provider Requesting: Dr Candise Che  Provider Telephone:  [phone]    Other Info:

## 2020-08-17 ENCOUNTER — Encounter (HOSPITAL_COMMUNITY): Payer: Self-pay

## 2020-08-17 NOTE — Progress Notes (Unsigned)
       Patient Demographics  Patient Name  Noami, Bove Legal Sex  Female DOB  Jul 26, 1975 SSN  [id number] Address  [address] [place]  [place] [address] Phone  [phone] [facility]) *Preferred*  [phone] (Work)  [phone] (Mobile)     RE: Biopsy Received: 3 days ago Sterling Big, MD  Lind Covert [facility] Procedure Requests Approved for US guided core biopsy of enlarged LEFT inguinal LN.   HKM         Previous Messages    ----- Message -----  From: Cory Munch  Sent: 08/14/2020  11:25 AM EDT  To: Ir Procedure Requests  Subject: Biopsy                                         Procedure Requested:  US Biopsy (lymph nodes)    Reason for Procedure: persistent 3.5cm palpable left inguinal lymphadenopathy for lymphoma workup    Provider Requesting: Dr Candise Che  Provider Telephone:  [phone]    Other Info:

## 2020-08-27 ENCOUNTER — Ambulatory Visit: Payer: No Typology Code available for payment source | Admitting: Hematology

## 2020-08-29 ENCOUNTER — Telehealth: Payer: Self-pay

## 2020-08-29 NOTE — Telephone Encounter (Signed)
Attempted to return call to pt.

## 2020-08-30 ENCOUNTER — Other Ambulatory Visit: Payer: Self-pay | Admitting: Radiology

## 2020-08-31 ENCOUNTER — Ambulatory Visit (HOSPITAL_COMMUNITY): Payer: No Typology Code available for payment source

## 2020-08-31 ENCOUNTER — Ambulatory Visit (HOSPITAL_COMMUNITY): Admission: RE | Admit: 2020-08-31 | Payer: No Typology Code available for payment source | Source: Ambulatory Visit

## 2020-09-04 ENCOUNTER — Telehealth: Payer: Self-pay | Admitting: Hematology

## 2020-09-04 NOTE — Telephone Encounter (Signed)
Left message with rescheduled upcoming appointment due to provider's emergency. 

## 2020-09-05 ENCOUNTER — Other Ambulatory Visit: Payer: Self-pay | Admitting: Internal Medicine

## 2020-09-05 ENCOUNTER — Inpatient Hospital Stay: Payer: No Typology Code available for payment source | Admitting: Hematology

## 2020-09-06 ENCOUNTER — Other Ambulatory Visit: Payer: Self-pay

## 2020-09-06 ENCOUNTER — Ambulatory Visit (HOSPITAL_COMMUNITY)
Admission: RE | Admit: 2020-09-06 | Discharge: 2020-09-06 | Disposition: A | Discharge status: Home / Self Care | Payer: No Typology Code available for payment source | Source: Ambulatory Visit | Attending: Hematology | Admitting: Hematology

## 2020-09-06 ENCOUNTER — Encounter (HOSPITAL_COMMUNITY): Payer: Self-pay

## 2020-09-06 DIAGNOSIS — E785 Hyperlipidemia, unspecified: Secondary | ICD-10-CM | POA: Insufficient documentation

## 2020-09-06 DIAGNOSIS — I10 Essential (primary) hypertension: Secondary | ICD-10-CM | POA: Insufficient documentation

## 2020-09-06 DIAGNOSIS — E7800 Pure hypercholesterolemia, unspecified: Secondary | ICD-10-CM | POA: Insufficient documentation

## 2020-09-06 DIAGNOSIS — Z885 Allergy status to narcotic agent status: Secondary | ICD-10-CM | POA: Diagnosis not present

## 2020-09-06 DIAGNOSIS — R590 Localized enlarged lymph nodes: Secondary | ICD-10-CM | POA: Diagnosis present

## 2020-09-06 DIAGNOSIS — Z79899 Other long term (current) drug therapy: Secondary | ICD-10-CM | POA: Insufficient documentation

## 2020-09-06 LAB — CBC
HCT: 36.2 % (ref 36.0–46.0)
Hemoglobin: 11.3 g/dL — ABNORMAL LOW (ref 12.0–15.0)
MCH: 25.8 pg — ABNORMAL LOW (ref 26.0–34.0)
MCHC: 31.2 g/dL (ref 30.0–36.0)
MCV: 82.6 fL (ref 80.0–100.0)
Platelets: 245 10*3/uL (ref 150–400)
RBC: 4.38 MIL/uL (ref 3.87–5.11)
RDW: 16.1 % — ABNORMAL HIGH (ref 11.5–15.5)
WBC: 8.7 10*3/uL (ref 4.0–10.5)
nRBC: 0 % (ref 0.0–0.2)

## 2020-09-06 LAB — PROTIME-INR
INR: 0.9 (ref 0.8–1.2)
Prothrombin Time: 12.1 seconds (ref 11.4–15.2)

## 2020-09-06 IMAGING — US US BIOPSY LYMPH NODE
1 series · 13 of 13 positions shown · non-contrast
Comparison: none

INDICATION: Enlarged left inguinal lymph node

[Series 2: us core biopsy lym nod mc & wl · 13 of 13 slices shown]
[im 1/13]
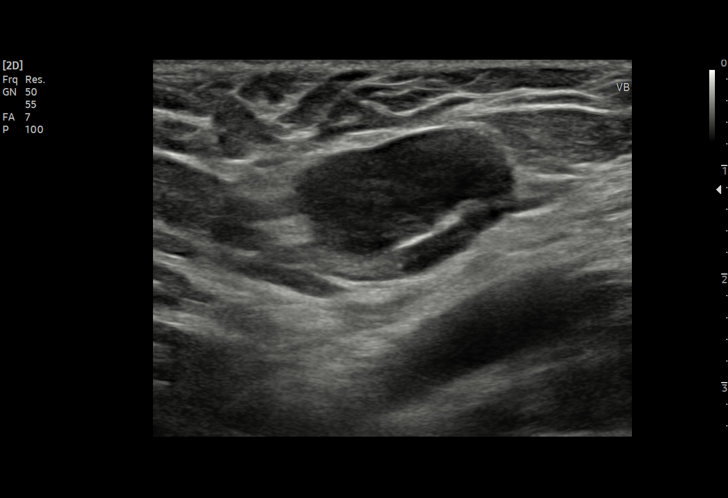
[im 2/13]
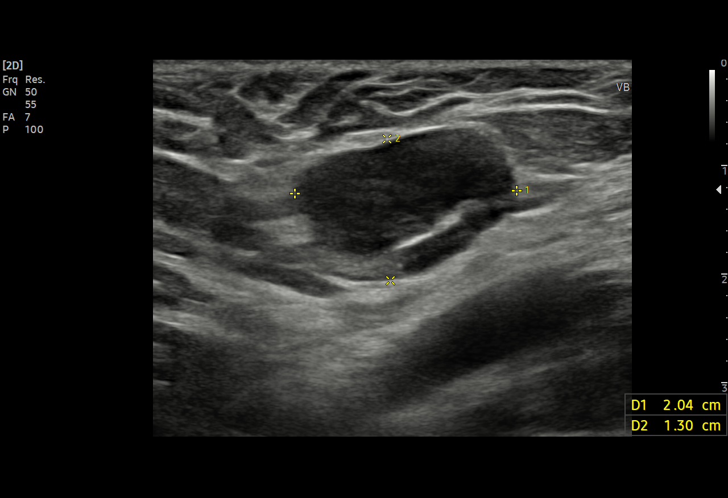
[im 3/13]
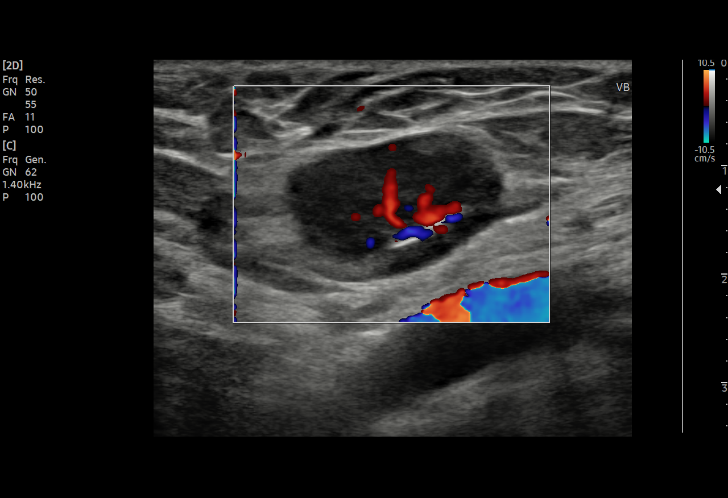
[im 4/13]
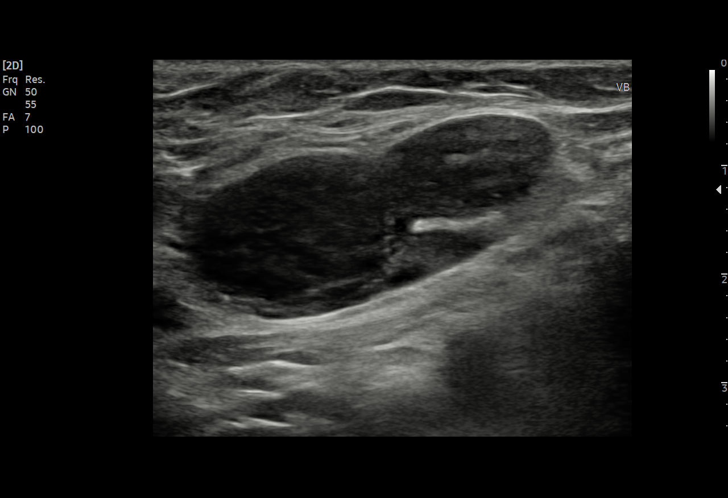
[im 5/13]
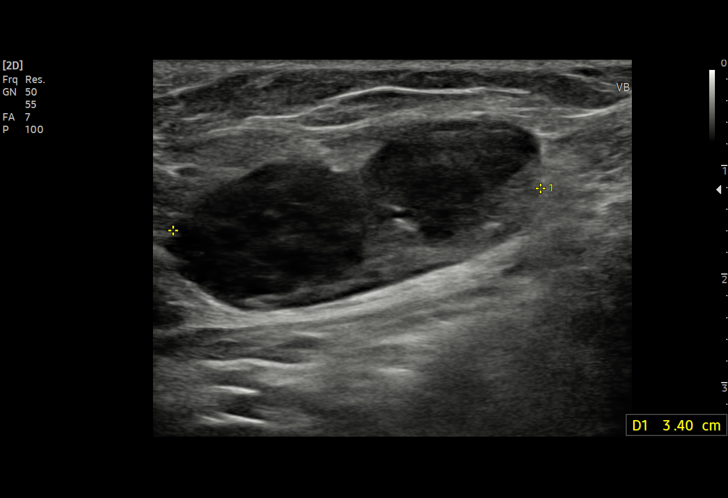
[im 6/13]
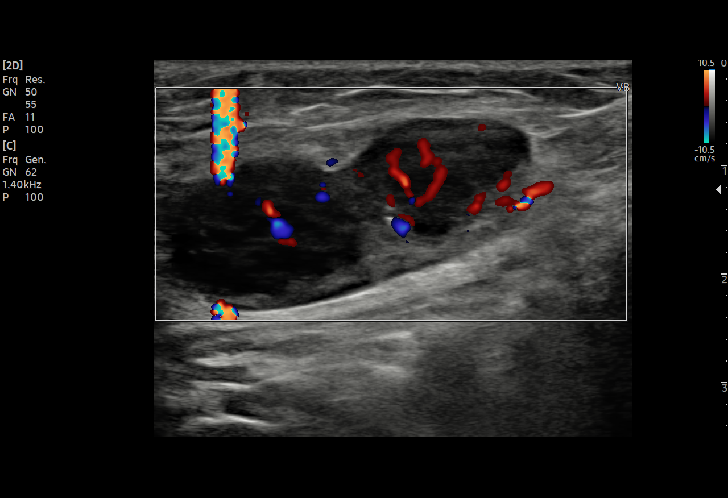
[im 7/13]
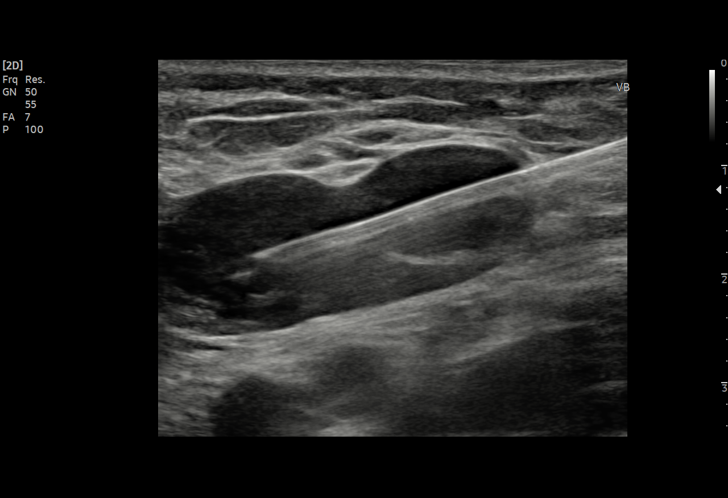
[im 8/13]
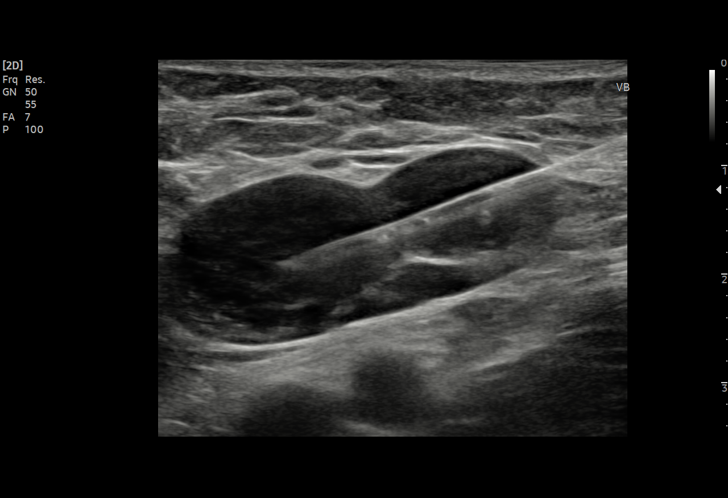
[im 9/13]
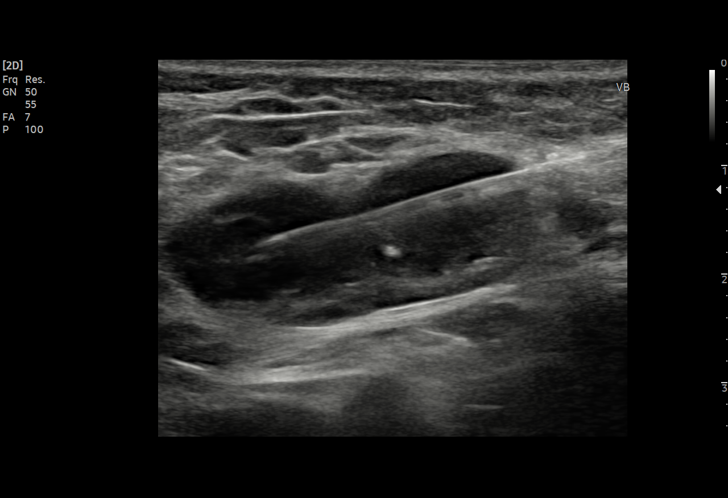
[im 10/13]
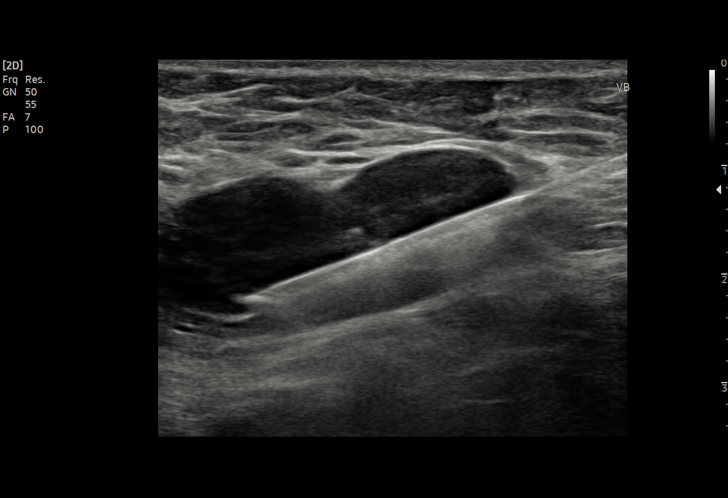
[im 11/13]
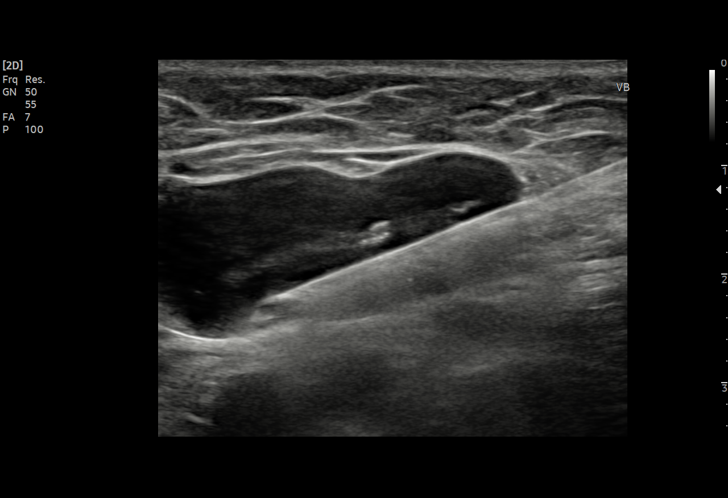
[im 12/13]
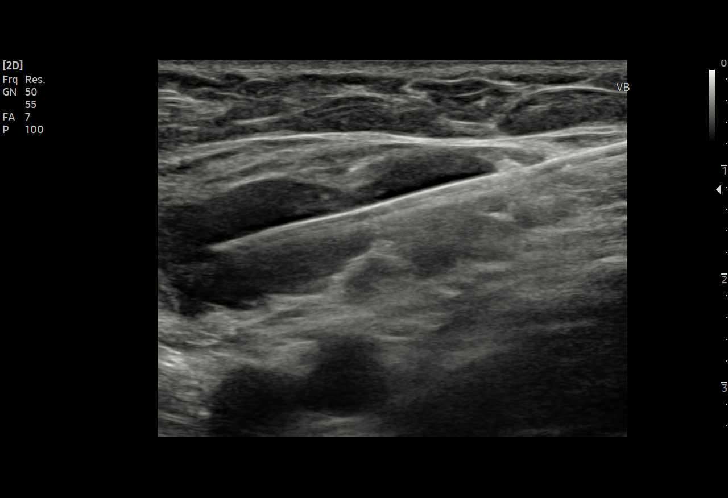
[im 13/13]
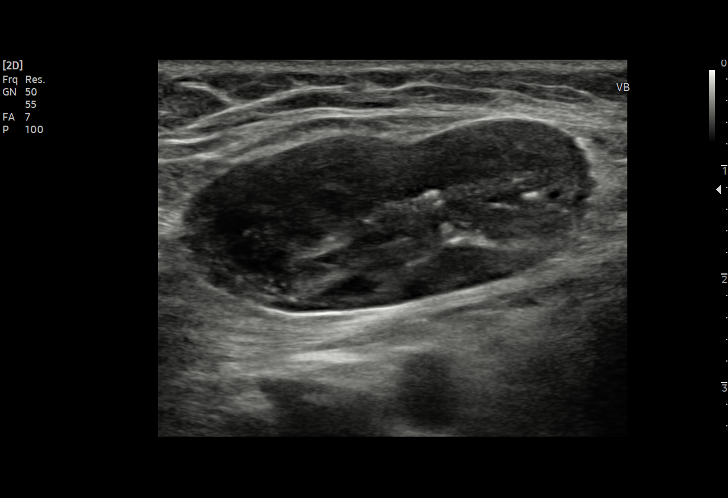

[13 of 13 positions shown; findings below may reference images not displayed]

EXAM:
ULTRASOUND GUIDED core needle BIOPSY OF left inguinal lymph node

MEDICATIONS:
None.

ANESTHESIA/SEDATION:
Local analgesia only

PROCEDURE:
The procedure, risks, benefits, and alternatives were explained to
the patient. Questions regarding the procedure were encouraged and
answered. The patient understands and consents to the procedure.

The patient was placed supine on the exam table. Limited ultrasound
of the left groin was performed, which again demonstrated a a large
left inguinal lymph node measuring up to 1.3 cm in the short axis.
Skin entry site was marked, the overlying skin was prepped and
draped in the standard sterile fashion. Local analgesia was obtained
with 1% lidocaine. Using ultrasound guidance, core needle biopsy was
performed of the left inguinal lymph node using an 18 gauge core
biopsy device x6 passes. Specimens were submitted in saline to
pathology for further handling. Limited postprocedure imaging
demonstrated expected post biopsy changes without hematoma. Sterile
dressing was placed. The patient tolerated the procedure well
without immediate complication.

COMPLICATIONS:
None immediate.
FINDINGS: Successful core needle biopsy of left inguinal lymph node using 18
gauge core biopsy device x6 passes.
IMPRESSION: Successful ultrasound-guided core needle biopsy of the enlarged left
inguinal lymph node.

## 2020-09-06 MED ORDER — FENTANYL CITRATE (PF) 100 MCG/2ML IJ SOLN
INTRAMUSCULAR | Status: AC
  Filled 2020-09-06: qty 2

## 2020-09-06 MED ORDER — SODIUM CHLORIDE 0.9 % IV SOLN: INTRAVENOUS | Status: DC

## 2020-09-06 MED ORDER — LIDOCAINE HCL 1 % IJ SOLN
INTRAMUSCULAR | Status: AC
  Filled 2020-09-06: qty 20

## 2020-09-06 MED ORDER — MIDAZOLAM HCL 2 MG/2ML IJ SOLN
INTRAMUSCULAR | Status: AC
  Filled 2020-09-06: qty 2

## 2020-09-06 MED ORDER — FENTANYL CITRATE (PF) 100 MCG/2ML IJ SOLN
INTRAMUSCULAR | Status: AC | PRN
  Administered 2020-09-06: 50 ug via INTRAVENOUS
  Administered 2020-09-06: 25 ug via INTRAVENOUS

## 2020-09-06 MED ORDER — MIDAZOLAM HCL 2 MG/2ML IJ SOLN
INTRAMUSCULAR | Status: AC | PRN
  Administered 2020-09-06: 0.5 mg via INTRAVENOUS
  Administered 2020-09-06: 1 mg via INTRAVENOUS

## 2020-09-06 NOTE — Discharge Instructions (Signed)
For questions /concerns may call Interventional Radiology at [phone]  You may remove your dressing and shower tomorrow afternoon    Moderate Conscious Sedation, Adult, Care After This sheet gives you information about how to care for yourself after your procedure. Your health care provider may also give you more specific instructions. If you have problems or questions, contact your health careprovider. What can I expect after the procedure? After the procedure, it is common to have: Sleepiness for several hours. Impaired judgment for several hours. Difficulty with balance. Vomiting if you eat too soon. Follow these instructions at home: For the time period you were told by your health care provider: Rest. Do not participate in activities where you could fall or become injured. Do not drive or use machinery. Do not drink alcohol. Do not take sleeping pills or medicines that cause drowsiness. Do not make important decisions or sign legal documents. Do not take care of children on your own. Eating and drinking  Follow the diet recommended by your health care provider. Drink enough fluid to keep your urine pale yellow. If you vomit: Drink water, juice, or soup when you can drink without vomiting. Make sure you have little or no nausea before eating solid foods.  General instructions Take over-the-counter and prescription medicines only as told by your health care provider. Have a responsible adult stay with you for the time you are told. It is important to have someone help care for you until you are awake and alert. Do not smoke. Keep all follow-up visits as told by your health care provider. This is important. Contact a health care provider if: You are still sleepy or having trouble with balance after 24 hours. You feel light-headed. You keep feeling nauseous or you keep vomiting. You develop a rash. You have a fever. You have redness or swelling around the IV site. Get  help right away if: You have trouble breathing. You have new-onset confusion at home. Summary After the procedure, it is common to feel sleepy, have impaired judgment, or feel nauseous if you eat too soon. Rest after you get home. Know the things you should not do after the procedure. Follow the diet recommended by your health care provider and drink enough fluid to keep your urine pale yellow. Get help right away if you have trouble breathing or new-onset confusion at home. This information is not intended to replace advice given to you by your health care provider. Make sure you discuss any questions you have with your healthcare provider.   Needle Biopsy, Care After This sheet gives you information about how to care for yourself after your procedure. Your health care provider may also give you more specific instructions. If you have problems or questions, contact your health careprovider. What can I expect after the procedure? After the procedure, it is common to have soreness, bruising, or mild pain atthe puncture site. This should go away in a few days. Follow these instructions at home: Needle insertion site care Wash your hands with soap and water before you change your bandage (dressing). If you cannot use soap and water, use hand sanitizer. Follow instructions from your health care provider about how to take care of your puncture site. This includes: When and how to change your dressing. When to remove your dressing. Check your puncture site every day for signs of infection. Check for: Redness, swelling, or pain. Fluid or blood. Pus or a bad smell. Warmth.   General instructions Return to your normal   activities as told by your health care provider. Ask your health care provider what activities are safe for you. Do not take baths, swim, or use a hot tub until your health care provider approves. Ask your health care provider if you may take showers. You may only be allowed to take  sponge baths. Take over-the-counter and prescription medicines only as told by your health care provider. Keep all follow-up visits as told by your health care provider. This is important. Contact a health care provider if: You have a fever. You have redness, swelling, or pain at the puncture site that lasts longer than a few days. You have fluid, blood, or pus coming from your puncture site. Your puncture site feels warm to the touch. Get help right away if: You have severe bleeding from the puncture site. Summary After the procedure, it is common to have soreness, bruising, or mild pain at the puncture site. This should go away in a few days. Check your puncture site every day for signs of infection, such as redness, swelling, or pain. Get help right away if you have severe bleeding from your puncture site. This information is not intended to replace advice given to you by your health care provider. Make sure you discuss any questions you have with your healthcare provider. Document Revised: 07/14/2019 Document Reviewed: 07/14/2019 Elsevier Patient Education  2022 [employer]      

## 2020-09-06 NOTE — Procedures (Signed)
Interventional Radiology Procedure Note  Date of Procedure: 09/06/2020  Procedure: [place] left inguinal LN biopsy   Findings:  1. Successful core needle Bx of left inguinal LN x6 passes    Complications: No immediate complications noted.   Estimated Blood Loss: minimal  Follow-up and Recommendations: 1. Follow up biopsy results    Olive Bass, MD  Vascular & Interventional Radiology  09/06/2020 3:45 PM

## 2020-09-06 NOTE — Consult Note (Signed)
Chief Complaint: Patient was seen in consultation today for  image guided left inguinal lymph node biopsy  Referring Physician(s): Johney Maine  Supervising Physician: Pernell Dupre  Patient Status: [facility] - Out-pt  History of Present Illness: Beverly Hobbs is a 45 y.o. female with PMH anemia, childhood asthma, hyperlipidemia, HTN who presents now with persistent palpable, nontender  left inguinal adenopathy. She has no prior hx of cancer or recent infections. She is scheduled today for image guided left inguinal LN bx.   Past Medical History:  Diagnosis Date   Anemia    H/O   Asthma    AS A CHILD   HSV-1 infection    H/O   Hypercholesterolemia    Hypertension    SAB (spontaneous abortion)    H/O    Past Surgical History:  Procedure Laterality Date   ADENOIDECTOMY     TONSILLECTOMY  2001    Allergies: Codeine and Hydrocodone-acetaminophen  Medications: Prior to Admission medications   Medication Sig Start Date End Date Taking? Authorizing Provider  amLODipine (NORVASC) 10 MG tablet Take 10 mg by mouth daily. 05/28/17   [provider]  atorvastatin (LIPITOR) 20 MG tablet Take 20 mg by mouth daily. 07/09/20   [provider]  cloNIDine (CATAPRES) 0.2 MG tablet Take 0.2 mg by mouth as needed.  05/26/17   [provider]  diazepam (VALIUM) 5 MG tablet Take 5 mg by mouth every 12 (twelve) hours as needed for anxiety. Patient not taking: Reported on 08/13/2020    [provider]  labetalol (NORMODYNE) 100 MG tablet Take 1 tablet (100 mg total) by mouth 2 (two) times daily. 03/18/16   Tomasita Crumble, MD  spironolactone (ALDACTONE) 25 MG tablet Take 25 mg by mouth 2 (two) times daily. Patient not taking: Reported on 08/13/2020    [provider]  topiramate (TOPAMAX) 50 MG tablet Take 1 tablet (50 mg total) by mouth 2 (two) times daily. 09/14/17   Penumalli, Glenford Bayley, MD     Family History  Problem Relation Age of Onset    Hypertension Mother    Bipolar disorder Mother    Fibromyalgia Mother    Heart disease Father    Hypertension Father    CVA Father        age 65   Hypertension Sister    Heart disease Sister    Fibroids Sister    Heart attack Maternal Grandmother    Heart disease Maternal Grandmother    Hypertension Paternal Grandmother    Heart disease Paternal Grandmother    Thyroid disease Paternal Grandmother    Heart disease Paternal Grandfather    Heart attack Paternal Grandfather     Social History   Socioeconomic History   Marital status: Married    Spouse name: Not on file   Number of children: 1   Years of education: MS RN   Highest education level: Not on file  Occupational History    Comment: RN, [employer]  Tobacco Use   Smoking status: Never   Smokeless tobacco: Never  Substance and Sexual Activity   Alcohol use: No   Drug use: No   Sexual activity: Yes    Birth control/protection: Other-see comments  Other Topics Concern   Not on file  Social History Narrative   Lives with husband, child   Caffeine- none   Social Determinants of Health   Financial Resource Strain: Not on file  Food Insecurity: Not on file  Transportation Needs: Not  on file  Physical Activity: Not on file  Stress: Not on file  Social Connections: Not on file      Review of Systems denies fever, headache, chest pain, dyspnea, cough,/back pain, nausea, bleeding, night sweats, weight loss  Vital Signs:   Physical Exam awake, alert.  Chest clear to auscultation bilaterally.  Heart with regular rate and rhythm.  Abdomen soft, positive bowel sounds,NT; palpable, nontender left inguinal adenopathy, no lower extremity edema.  Imaging: No results found.  Labs:  CBC: Recent Labs    08/13/20 1413  WBC 6.3  HGB 9.8*  HCT 31.0*  PLT 314    COAGS: No results for input(s): INR, APTT in the last 8760 hours.  BMP: Recent Labs    08/13/20 1413  NA 141  K 3.8  CL 105  CO2 28  GLUCOSE  80  BUN 11  CALCIUM 9.3  CREATININE 0.74  GFRNONAA >60    LIVER FUNCTION TESTS: Recent Labs    08/13/20 1413  BILITOT 0.2*  AST 17  ALT 16  ALKPHOS 61  PROT 7.7  ALBUMIN 4.0    TUMOR MARKERS: No results for input(s): AFPTM, CEA, CA199, CHROMGRNA in the last 8760 hours.  Assessment and Plan: 45 y.o. female with PMH anemia, childhood asthma, hyperlipidemia, HTN who presents now with persistent palpable, nontender  left inguinal adenopathy. She has no prior hx of cancer or recent infections. She is scheduled today for image guided left inguinal LN bx. Risks and benefits of procedure was discussed with the patient including, but not limited to bleeding, infection, damage to adjacent structures or low yield requiring additional tests.  All of the questions were answered and there is agreement to proceed.  Consent signed and in chart.    Thank you for this interesting consult.  I greatly enjoyed meeting [name] and look forward to participating in their care.  A copy of this report was sent to the requesting provider on this date.  Electronically Signed: D. Jeananne Rama, PA-C 09/06/2020, 11:41 AM   I spent a total of  20 minutes   in face to face in clinical consultation, greater than 50% of which was counseling/coordinating care for image guided left inguinal lymph node biopsy

## 2020-09-10 LAB — SURGICAL PATHOLOGY

## 2020-09-21 ENCOUNTER — Inpatient Hospital Stay: Payer: No Typology Code available for payment source | Attending: Hematology | Admitting: Hematology

## 2020-09-21 DIAGNOSIS — R599 Enlarged lymph nodes, unspecified: Secondary | ICD-10-CM

## 2020-09-21 DIAGNOSIS — D500 Iron deficiency anemia secondary to blood loss (chronic): Secondary | ICD-10-CM | POA: Diagnosis not present

## 2020-09-21 NOTE — Progress Notes (Signed)
HEMATOLOGY/ONCOLOGY CLINIC NOTE  Date of Service: 09/21/2020  Patient Care Team: Beverly Able, MD as PCP - General (Family Medicine)  CHIEF COMPLAINTS/PURPOSE OF CONSULTATION:  Inguinal Lymphadenopathy  HISTORY OF PRESENTING ILLNESS:   Beverly Hobbs is a wonderful 45 y.o. female who has been referred to [place] by Dr. Leilani Able, MD for evaluation and management of inguinal lymphadenopathy. The pt reports that she is doing well overall.  The pt reports that she noticed the inguinal lymph nodes first back in March. She was shaving in the shower and noted the lymph node first incidentally. She then gave it a couple weeks to see if it resolved, but did not. She denies any stubbed toes or infections. She notes a history of eczema and one recent instance of recurrence, but this was mild and resolved since. These lymph nodes seemed to continue to increase in size and were non-tender. This is what the workup for her [place] and MRI were. The pt notes she has Adenomyosis and they did an [place] in January for this. They had seen a fibroid on that, but no other issues. She denies any signs of infections or any boils. She has not had any abnormal vaginal discharge. She notes the adenomyosis causes intermittent heavy bleeding, but has been stable recently.   In the last six months, the pt notes some pelvic heaviness and a pressure on the sign of the lymph nodes. She denies any pain or hurt upon touching the lymph nodes. She denies any history of hemorrhoids or changes in bowel habits. The pt has not noted any other lumps or bumps. She gets very winded after light exertion, which is new for her as well. The pt was recently started on Integra. She notes this is a combination of fatigue and SOB. It has improved since starting Integra around one month ago. Prior to this, she was even too fatigued to fully shower without rest. She notes a history of childhood asthma and hypertension. The pt notes she is back on all the  meds for her hypertensive crisis and was improving down to just one medicine recently, until another bad episode around three weeks ago. She has a history of   The pt notes a dry cough that has started since the pandemic. 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  The pt notes only rare social alcohol intake, denies h/o smoking or drug use. She had adverse reactions to hydrocodone. She is on Topamax for her migraines.  Her periods have been very heavy, lasting five days and filling pads every two hours. This fluctuates, as the last month was very light prior to the most recent heavy cycle.  The patient notes the size of the lymph node is still the size of a marble, but very persistent. The patient notes she received her first booster in late November. She has not gotten the second booster yet due to her current symptoms. She denies any concern for blood product exposure. The patient notes she rarely gets sick and has a very strong immune system. She denies any familial history of autoimmune conditions or sarcoidosis.  The patient notes she recently had a mammogram within the last year. This noted no concerns.  The pt has had MR Pelvis ([id number]) on 07/21/2020, which revealed "Mild left inguinal and deep inguinal lymphadenopathy. Although nonspecific, given persistence, indolent lymphoproliferative disorder is not excluded. Consider tissue sampling/surgical excision of  the inguinal node."  The pt has had MR Abdomen ([id number]) on 07/21/2020.  The pt has had US Pelvis Limited (Transabdominal only) on 07/06/2020, which revealed "Prominent left inguinal lymph node with loss of normal fatty hilus. Although these changes may be reactive in nature the possibility of neoplastic involvement deserves consideration. Correlation with laboratory values is recommended. Additional imaging may be helpful to evaluate for other sites of  lymphadenopathy. Additionally tissue sampling may be helpful."  Lab results 06/19/2020 of CBC w/diff  is as follows: all values are WNL except for Hgb of 10.0, HCT of 33.1, MCV of 78.3, MCH of 23.6, MCHC of 30.2, RDW of 171, MPV of 13.4.  On review of systems, pt reports inguinal lymph nodes, fatigue, SOB, dry cough and denies chest pain, productive cough, changes in bowel habits, fevers, chills, night sweats, sudden weight loss, and any other symptoms.  INTERVAL HISTORY  .I connected with [name] on 09/21/20 at  9:20 AM EDT by telephone visit and verified that I am speaking with the correct person using two identifiers.   I discussed the limitations, risks, security and privacy concerns of performing an evaluation and management service by telemedicine and the availability of in-person appointments. I also discussed with the patient that there may be a patient responsible charge related to this service. The patient expressed understanding and agreed to proceed.   Patient notes no acute new symptoms since her last visit.  No fevers no chills no night sweats no new lumps or bumps.  Her inguinal lymph node feels about the same no new pain or swelling. Patient notes that she continues to have very heavy periods lasting 7 days of which several days are quite heavy.  Known history of uterine adenomyosis.  She previously used Lysteda to control her menorrhagia but is not using this currently.  She has been trying to get in touch with her OB/GYN doctor to follow-up to address her menorrhagia. Currently taking p.o. iron replacement Integra 1 capsule daily.  No abnormal vaginal discharge. Has had eczema in her lower extremities but currently quiescent.  We discussed her lymph node biopsy results in detail.  This shows reactive lymphadenopathy with no evidence of lymphoma or metastatic malignancy granulomas or other specific infectious process.  Her other lab results from 08/13/2020 were also  discussed in details. CBC shows mild anemia due to menorrhagia.  Ferritin 19 iron saturation 45% B12 220. LDH and sedimentation rate within normal limits. Hepatitis C and HIV testing negative.   MEDICAL HISTORY:  Past Medical History:  Diagnosis Date   Anemia    H/O   Asthma    AS A CHILD   HSV-1 infection    H/O   Hypercholesterolemia    Hypertension    SAB (spontaneous abortion)    H/O    SURGICAL HISTORY: Past Surgical History:  Procedure Laterality Date   ADENOIDECTOMY     TONSILLECTOMY  2001    SOCIAL HISTORY: Social History   Socioeconomic History   Marital status: Married    Spouse name: Not on file   Number of children: 1   Years of education: MS RN   Highest education level: Not on file  Occupational History    Comment: RN, [employer]  Tobacco Use   Smoking status: Never   Smokeless tobacco: Never  Substance and Sexual Activity   Alcohol use: No   Drug use: No   Sexual activity: Yes    Birth control/protection: Other-see comments  Other  Topics Concern   Not on file  Social History Narrative   Lives with husband, child   Caffeine- none   Social Determinants of Health   Financial Resource Strain: Not on file  Food Insecurity: Not on file  Transportation Needs: Not on file  Physical Activity: Not on file  Stress: Not on file  Social Connections: Not on file  Intimate Partner Violence: Not on file    FAMILY HISTORY: Family History  Problem Relation Age of Onset   Hypertension Mother    Bipolar disorder Mother    Fibromyalgia Mother    Heart disease Father    Hypertension Father    CVA Father        age 46   Hypertension Sister    Heart disease Sister    Fibroids Sister    Heart attack Maternal Grandmother    Heart disease Maternal Grandmother    Hypertension Paternal Grandmother    Heart disease Paternal Grandmother    Thyroid disease Paternal Grandmother    Heart disease Paternal Grandfather    Heart attack Paternal Grandfather      ALLERGIES:  is allergic to codeine and hydrocodone-acetaminophen.  MEDICATIONS:  Current Outpatient Medications  Medication Sig Dispense Refill   amLODipine (NORVASC) 10 MG tablet Take 10 mg by mouth daily.     atorvastatin (LIPITOR) 20 MG tablet Take 20 mg by mouth daily.     cloNIDine (CATAPRES) 0.2 MG tablet Take 0.2 mg by mouth as needed.      diazepam (VALIUM) 5 MG tablet Take 5 mg by mouth every 12 (twelve) hours as needed for anxiety. (Patient not taking: Reported on 08/13/2020)     labetalol (NORMODYNE) 100 MG tablet Take 1 tablet (100 mg total) by mouth 2 (two) times daily. 60 tablet 0   spironolactone (ALDACTONE) 25 MG tablet Take 25 mg by mouth 2 (two) times daily. (Patient not taking: Reported on 08/13/2020)     topiramate (TOPAMAX) 50 MG tablet Take 1 tablet (50 mg total) by mouth 2 (two) times daily. 60 tablet 12   No current facility-administered medications for this visit.    REVIEW OF SYSTEMS:    10 Point review of Systems was done is negative except as noted above.  PHYSICAL EXAMINATION: We discussed her lymph node biopsy results in detail.  This shows reactive lymphadenopathy with no evidence of lymphoma or metastatic malignancy granulomas or other specific infectious process.  Her other lab results from 08/13/2020 were also discussed in details. CBC shows mild anemia due to menorrhagia.  Ferritin 19 iron saturation 45% B12 220. LDH and sedimentation rate within normal limits. Hepatitis C and HIV testing negative.  LABORATORY DATA:  I have reviewed the data as listed  . CBC Latest Ref Rng & Units 09/06/2020 08/13/2020 05/28/2017  WBC 4.0 - 10.5 K/uL 8.7 6.3 7.7  Hemoglobin 12.0 - 15.0 g/dL 11.3(L) 9.8(L) 10.7(L)  Hematocrit 36.0 - 46.0 % 36.2 31.0(L) 32.9(L)  Platelets 150 - 400 K/uL 245 314 348    . CMP Latest Ref Rng & Units 08/13/2020 05/28/2017 03/17/2016  Glucose 70 - 99 mg/dL 80 98 92  BUN 6 - 20 mg/dL 11 10 12   Creatinine 0.44 - 1.00 mg/dL 1.610.74 0.960.73  0.450.75  Sodium 135 - 145 mmol/L 141 139 136  Potassium 3.5 - 5.1 mmol/L 3.8 4.2 4.1  Chloride 98 - 111 mmol/L 105 102 98(L)  CO2 22 - 32 mmol/L 28 26 26   Calcium 8.9 - 10.3 mg/dL 9.3 9.9 9.4  Total Protein 6.5 - 8.1 g/dL 7.7 - 7.8  Total Bilirubin 0.3 - 1.2 mg/dL 4.0(J) - 0.5  Alkaline Phos 38 - 126 U/L 61 - 61  AST 15 - 41 U/L 17 - 18  ALT 0 - 44 U/L 16 - 14   . Lab Results  Component Value Date   IRON 167 (H) 08/13/2020   TIBC 375 08/13/2020   IRONPCTSAT 45 08/13/2020   (Iron and TIBC)  Lab Results  Component Value Date   FERRITIN 19 08/13/2020    . Lab Results  Component Value Date   LDH 148 08/13/2020     SURGICAL PATHOLOGY  CASE: [id number]  PATIENT: Bradley Ferris  Surgical Pathology Report      Clinical History: Palpable lymph node (crm)      FINAL MICROSCOPIC DIAGNOSIS:   A. LYMPH NODE, LEFT INGUINAL, BIOPSY:  -Reactive lymphoid hyperplasia  -See comment   COMMENT:   The sections show relative preservation of the nodal architecture.  The  lymphoid tissue is predominantly composed of small lymphocytes with no  overt atypia.  This is associated with scattering of lymphoid follicles  with reactive appearing germinal centers.  No metastatic malignancy or  granulomata are identified.  Flow cytometric analysis was performed [facility]  [phone]) and failed to show any monoclonal B-cell population or abnormal  T-cell phenotype.  The overall features are nonspecific but consistent  with reactive lymphoid hyperplasia.  If lymphadenopathy is persistent,  excisional biopsy is recommended.   RADIOGRAPHIC STUDIES: I have personally reviewed the radiological images as listed and agreed with the findings in the report. [place] CORE BIOPSY (LYMPH NODES)  Result Date: 09/06/2020 INDICATION: Enlarged left inguinal lymph node EXAM: ULTRASOUND GUIDED core needle BIOPSY OF left inguinal lymph node MEDICATIONS: None. ANESTHESIA/SEDATION: Local analgesia only PROCEDURE: The  procedure, risks, benefits, and alternatives were explained to the patient. Questions regarding the procedure were encouraged and answered. The patient understands and consents to the procedure. The patient was placed supine on the exam table. Limited ultrasound of the left groin was performed, which again demonstrated a a large left inguinal lymph node measuring up to 1.3 cm in the short axis. Skin entry site was marked, the overlying skin was prepped and draped in the standard sterile fashion. Local analgesia was obtained with 1% lidocaine. Using ultrasound guidance, core needle biopsy was performed of the left inguinal lymph node using an 18 gauge core biopsy device x6 passes. Specimens were submitted in saline to pathology for further handling. Limited postprocedure imaging demonstrated expected post biopsy changes without hematoma. Sterile dressing was placed. The patient tolerated the procedure well without immediate complication. COMPLICATIONS: None immediate. FINDINGS: Successful core needle biopsy of left inguinal lymph node using 18 gauge core biopsy device x6 passes. IMPRESSION: Successful ultrasound-guided core needle biopsy of the enlarged left inguinal lymph node. 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 ObGyn - connect. PLAN -We discussed her lymph node biopsy results in detail.  This shows reactive lymphadenopathy with no evidence of lymphoma or metastatic malignancy granulomas or other specific infectious process. -Could be reactive to her eczema or from adenomyosis with some uterine inflammation. Her  other lab results from 08/13/2020 were also discussed in details. CBC  shows mild anemia due to menorrhagia.  Ferritin 19 iron saturation 45% B12 220. LDH and sedimentation rate within normal limits. Hepatitis C and HIV testing negative. -Follow-up with primary care physician to monitor for lymphadenopathy and consider repeat CT chest abdomen pelvis in 6 to 12 months to rule out any other progressive lymphadenopathy. -Her hemoglobin levels are improving with oral iron and are up to 11.3.  Continue oral iron at this time.  Optimize oral iron replacement with primary care physician.  If she gets more iron deficient or anemic despite optimal oral iron replacement we shall be happy to offer her IV iron replacement. -Follow-up with OB/GYN for management of her menorrhagia related likely to uterine adenomyosis.  FOLLOW UP: follow-up with primary care physician Return to clinic with Dr. Candise Che as needed  All of the patients questions were answered with apparent satisfaction. The patient knows to call the clinic with any problems, questions or concerns.  . The total time spent in the appointment was 20 minutes and more than 50% was on counseling and direct patient cares.     Wyvonnia Lora MD MS AAHIVMS [facility] [address] Hematology/Oncology Physician [facility]  (Office):       [phone] (Work cell):  [phone] (Fax):           [phone]

## 2021-05-12 ENCOUNTER — Emergency Department (HOSPITAL_BASED_OUTPATIENT_CLINIC_OR_DEPARTMENT_OTHER)
Admission: EM | Admit: 2021-05-12 | Discharge: 2021-05-13 | Disposition: A | Discharge status: Home / Self Care | Payer: No Typology Code available for payment source | Attending: Emergency Medicine | Admitting: Emergency Medicine

## 2021-05-12 ENCOUNTER — Encounter (HOSPITAL_BASED_OUTPATIENT_CLINIC_OR_DEPARTMENT_OTHER): Payer: Self-pay | Admitting: Urology

## 2021-05-12 ENCOUNTER — Other Ambulatory Visit: Payer: Self-pay

## 2021-05-12 DIAGNOSIS — M79662 Pain in left lower leg: Secondary | ICD-10-CM | POA: Diagnosis not present

## 2021-05-12 DIAGNOSIS — M79605 Pain in left leg: Secondary | ICD-10-CM | POA: Insufficient documentation

## 2021-05-12 LAB — BASIC METABOLIC PANEL
Anion gap: 7 (ref 5–15)
BUN: 19 mg/dL (ref 6–20)
CO2: 26 mmol/L (ref 22–32)
Calcium: 9.4 mg/dL (ref 8.9–10.3)
Chloride: 105 mmol/L (ref 98–111)
Creatinine, Ser: 0.89 mg/dL (ref 0.44–1.00)
GFR, Estimated: >60 mL/min (ref >60)
Glucose, Bld: 82 mg/dL (ref 70–99)
Potassium: 3.8 mmol/L (ref 3.5–5.1)
Sodium: 138 mmol/L (ref 135–145)

## 2021-05-12 LAB — CBC WITH DIFFERENTIAL/PLATELET
Abs Immature Granulocytes: 0.03 10*3/uL (ref 0.00–0.07)
Basophils Absolute: 0 10*3/uL (ref 0.0–0.1)
Basophils Relative: 0 %
Eosinophils Absolute: 0.2 10*3/uL (ref 0.0–0.5)
Eosinophils Relative: 2 %
HCT: 32.8 % — ABNORMAL LOW (ref 36.0–46.0)
Hemoglobin: 10.6 g/dL — ABNORMAL LOW (ref 12.0–15.0)
Immature Granulocytes: 0 %
Lymphocytes Relative: 16 %
Lymphs Abs: 1.7 10*3/uL (ref 0.7–4.0)
MCH: 27.1 pg (ref 26.0–34.0)
MCHC: 32.3 g/dL (ref 30.0–36.0)
MCV: 83.9 fL (ref 80.0–100.0)
Monocytes Absolute: 1.1 10*3/uL — ABNORMAL HIGH (ref 0.1–1.0)
Monocytes Relative: 11 %
Neutro Abs: 7.2 10*3/uL (ref 1.7–7.7)
Neutrophils Relative %: 71 %
Platelets: 287 10*3/uL (ref 150–400)
RBC: 3.91 MIL/uL (ref 3.87–5.11)
RDW: 14.5 % (ref 11.5–15.5)
WBC: 10.2 10*3/uL (ref 4.0–10.5)
nRBC: 0 % (ref 0.0–0.2)

## 2021-05-12 MED ORDER — IBUPROFEN 800 MG PO TABS
800.0000 mg | ORAL_TABLET | Freq: Once | ORAL | Status: AC
  Administered 2021-05-12: 800 mg via ORAL
  Filled 2021-05-12: qty 1

## 2021-05-12 NOTE — ED Provider Notes (Signed)
? ?[facility] EMERGENCY DEPARTMENT  ?Provider Note ? ?CSN: [id number] ?Arrival date & time: 05/12/21 2238 ? ?History ?No chief complaint on file. ? ? ?Beverly Hobbs is a 46 y.o. female who reports 2 days ago she flew back from [facility] and began having some aching pain in her L posterior leg down into her calf. Pain has been persistent, not associated with swelling, redness, numbness. No CP or SOB. She is concerned about DVT. No prior history of same. She took ASA 325mg  x 2 earlier today.  ? ? ?Home Medications ?Prior to Admission medications   ?Medication Sig Start Date End Date Taking? Authorizing Provider  ?amLODipine (NORVASC) 10 MG tablet Take 10 mg by mouth daily. 05/28/17   [provider]  ?atorvastatin (LIPITOR) 20 MG tablet Take 20 mg by mouth daily. 07/09/20   [provider]  ?cloNIDine (CATAPRES) 0.2 MG tablet Take 0.2 mg by mouth as needed.  05/26/17   [provider]  ?diazepam (VALIUM) 5 MG tablet Take 5 mg by mouth every 12 (twelve) hours as needed for anxiety. ?Patient not taking: Reported on 08/13/2020    [provider]  ?labetalol (NORMODYNE) 100 MG tablet Take 1 tablet (100 mg total) by mouth 2 (two) times daily. 03/18/16   03/20/16, MD  ?spironolactone (ALDACTONE) 25 MG tablet Take 25 mg by mouth 2 (two) times daily. ?Patient not taking: Reported on 08/13/2020    [provider]  ?topiramate (TOPAMAX) 50 MG tablet Take 1 tablet (50 mg total) by mouth 2 (two) times daily. 09/14/17   Penumalli, 09/16/17, MD  ? ? ? ?Allergies    ?Codeine and Hydrocodone-acetaminophen ? ? ?Review of Systems   ?Review of Systems ?Please see HPI for pertinent positives and negatives ? ?Physical Exam ?BP 133/73   Pulse 89   Temp 98.8 ?F (37.1 ?C) (Oral)   Resp 18   Ht 5\' 6"  (1.676 m)   Wt 95.3 kg   LMP 04/27/2021 (Exact Date)   SpO2 97%   BMI 33.89 kg/m?  ? ?Physical Exam ?Vitals and nursing note reviewed.  ?Constitutional:   ?   Appearance: Normal  appearance.  ?HENT:  ?   Head: Normocephalic and atraumatic.  ?   Nose: Nose normal.  ?   Mouth/Throat:  ?   Mouth: Mucous membranes are moist.  ?Eyes:  ?   Extraocular Movements: Extraocular movements intact.  ?   Conjunctiva/sclera: Conjunctivae normal.  ?Cardiovascular:  ?   Rate and Rhythm: Normal rate.  ?Pulmonary:  ?   Effort: Pulmonary effort is normal.  ?   Breath sounds: Normal breath sounds.  ?Abdominal:  ?   General: Abdomen is flat.  ?   Palpations: Abdomen is soft.  ?   Tenderness: There is no abdominal tenderness.  ?Musculoskeletal:     ?   General: Tenderness (mild calf tenderness) present. No swelling. Normal range of motion.  ?   Cervical back: Neck supple.  ?   Right lower leg: No edema.  ?   Left lower leg: No edema.  ?Skin: ?   General: Skin is warm and dry.  ?Neurological:  ?   General: No focal deficit present.  ?   Mental Status: She is alert.  ?Psychiatric:     ?   Mood and Affect: Mood normal.  ? ? ?ED Results / Procedures / Treatments   ?EKG ?None ? ?Procedures ?Procedures ? ?Medications Ordered in the ED ?Medications  ?enoxaparin (LOVENOX) injection  95 mg (has no administration in time range)  ?ibuprofen (ADVIL) tablet 800 mg (800 mg Oral Given 05/12/21 2342)  ? ? ?Initial Impression and Plan ? Patient with LLE calf pain after a long flight. [place] is not available at this time. Will check basic labs to ensure normal blood counts and kidney function. Plan Lovenox injection and return in AM for doppler. Patient is amenable to that plan.  ? ?ED Course  ? ?Clinical Course as of 05/13/21 0009  ?Sun May 12, 2021  ?2343 CBC with mild anemia about at baseline.  [CS]  ?Mon May 13, 2021  ?0009 BMP is normal. Will dose with Lovenox, schedule for outpatient doppler tomorrow.  [CS]  ?  ?Clinical Course User Index ?[CS] Pollyann Savoy, MD  ? ? ? ?MDM Rules/Calculators/A&P ?Medical Decision Making ?Problems Addressed: ?Left leg pain: acute illness or injury ? ?Amount and/or Complexity of Data  Reviewed ?Labs: ordered. Decision-making details documented in ED Course. ?Radiology: ordered. ? ?Risk ?Prescription drug management. ? ? ? ?Final Clinical Impression(s) / ED Diagnoses ?Final diagnoses:  ?Left leg pain  ? ? ?Rx / DC Orders ?ED Discharge Orders   ? ?      Ordered  ?  US Venous Img Lower Unilateral Left       ? 05/13/21 0008  ? ?  ?  ? ?  ? ?  ?Pollyann Savoy, MD ?05/13/21 0009 ? ?

## 2021-05-12 NOTE — ED Triage Notes (Signed)
Flew back from [facility] Friday, starting yesterday afternoon having pain in left calf, pain worsening throughout the day  ? ?

## 2021-05-13 ENCOUNTER — Ambulatory Visit (HOSPITAL_BASED_OUTPATIENT_CLINIC_OR_DEPARTMENT_OTHER)
Admission: RE | Admit: 2021-05-13 | Discharge: 2021-05-13 | Disposition: A | Discharge status: Home / Self Care | Payer: No Typology Code available for payment source | Source: Ambulatory Visit | Attending: Emergency Medicine | Admitting: Emergency Medicine

## 2021-05-13 DIAGNOSIS — M79662 Pain in left lower leg: Secondary | ICD-10-CM | POA: Insufficient documentation

## 2021-05-13 IMAGING — US US EXTREM LOW VENOUS*L*
1 series · 14 of 24 positions shown · non-contrast
Comparison: None.

CLINICAL DATA: Left calf pain.

EXAM:
Left LOWER EXTREMITY VENOUS DOPPLER ULTRASOUND
TECHNIQUE: Gray-scale sonography with compression, as well as color and duplex
ultrasound, were performed to evaluate the deep venous system(s)
from the level of the common femoral vein through the popliteal and
proximal calf veins.

[Series 1: us extrem low venous*left* · 14 of 43 slices shown]
[im 1/43]
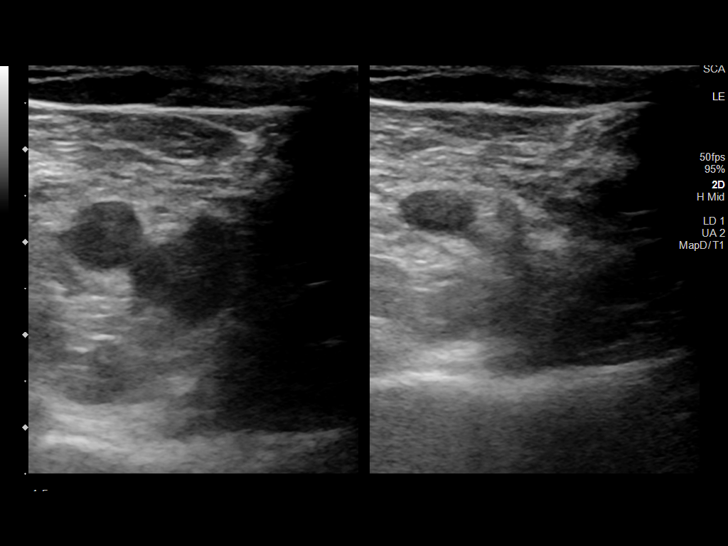
[im 4/43]
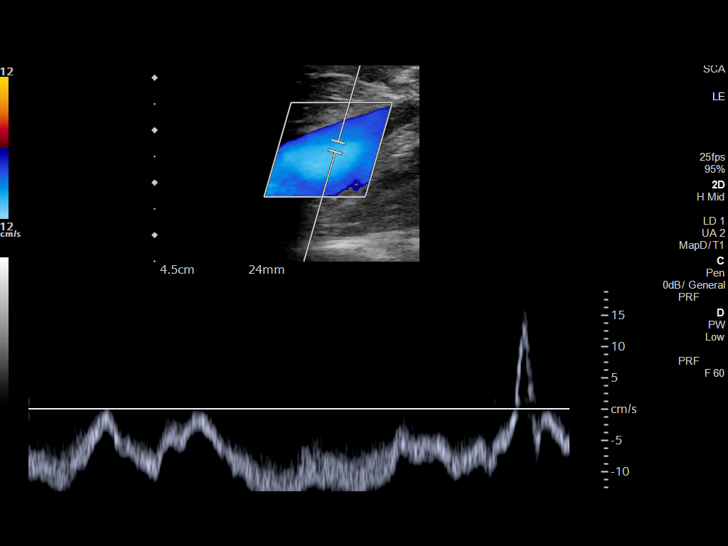
[im 8/43]
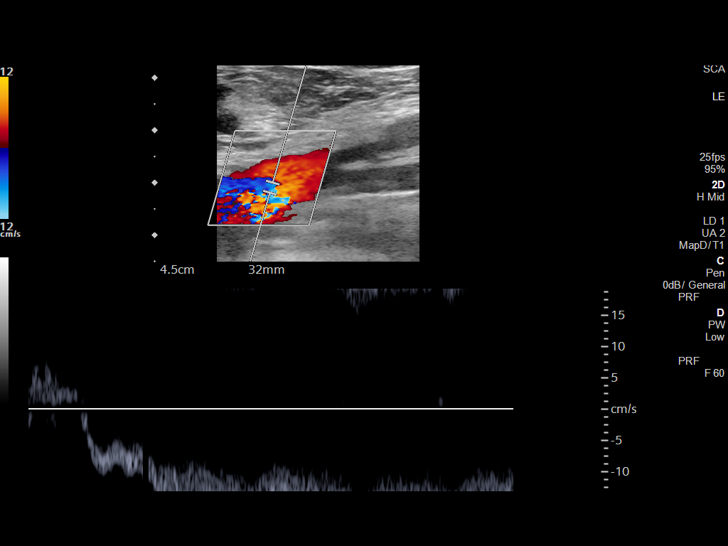
[im 11/43]
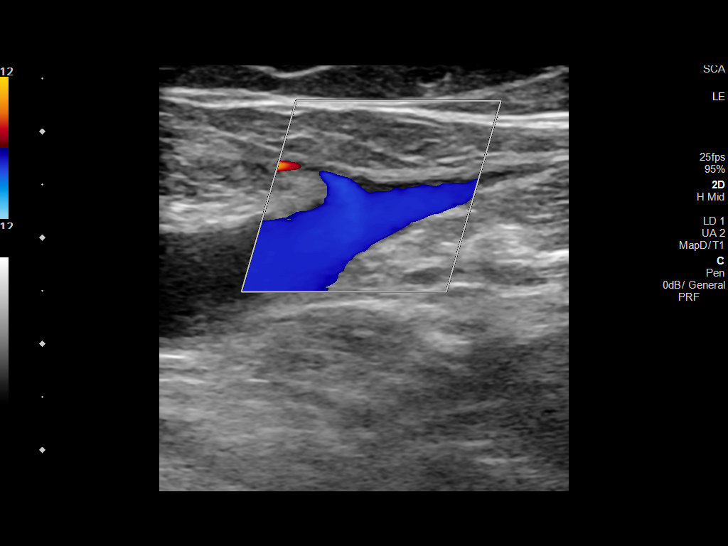
[im 13/43]
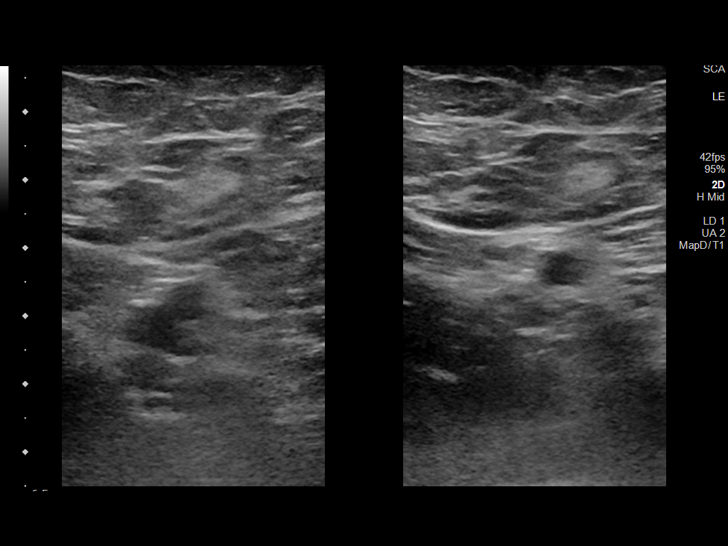
[im 17/43]
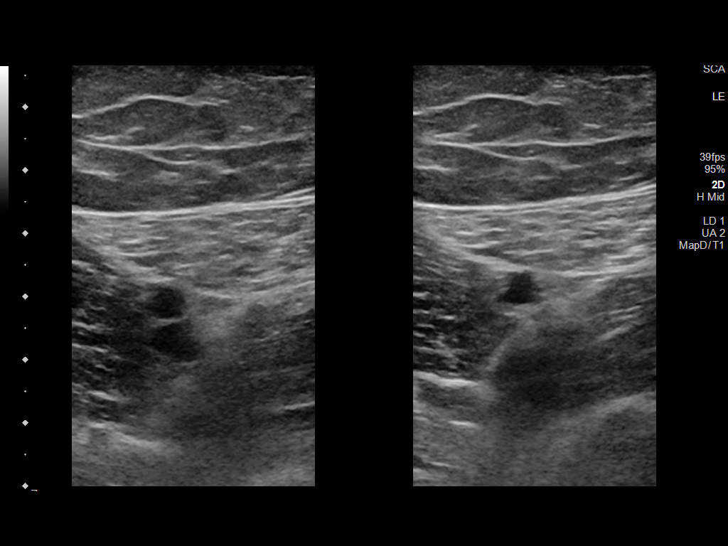
[im 21/43]
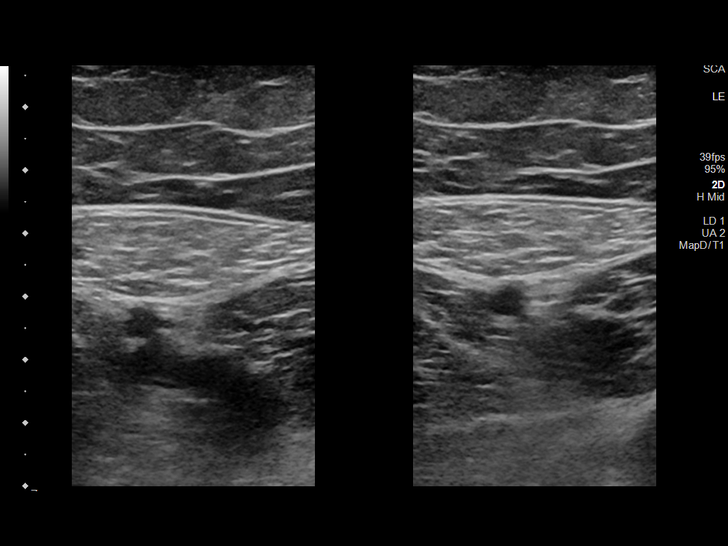
[im 22/43]
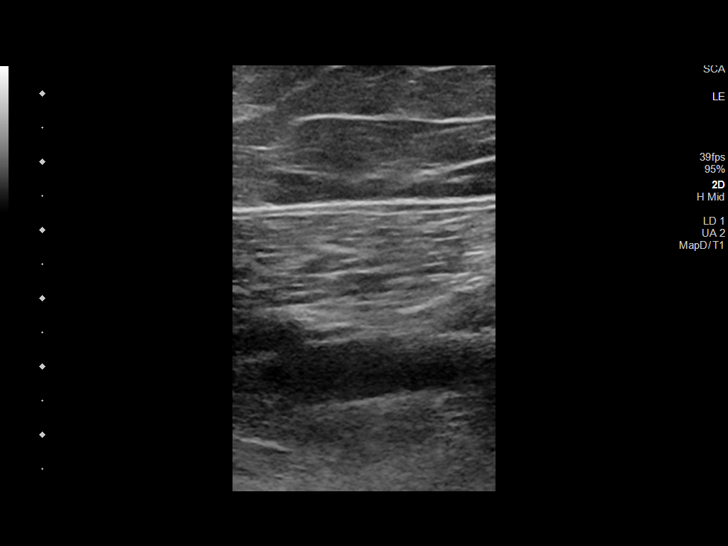
[im 26/43]
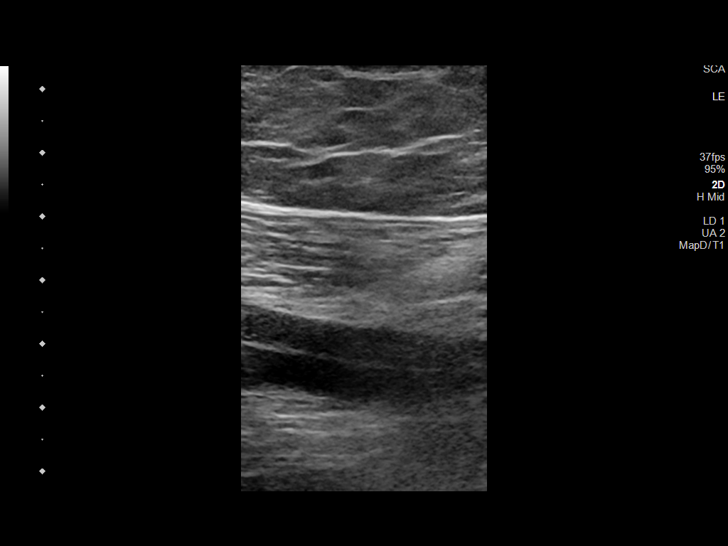
[im 30/43]
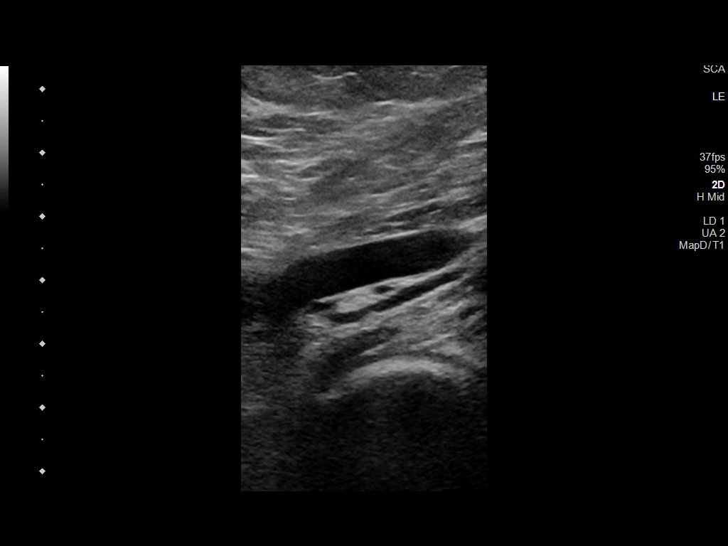
[im 33/43]
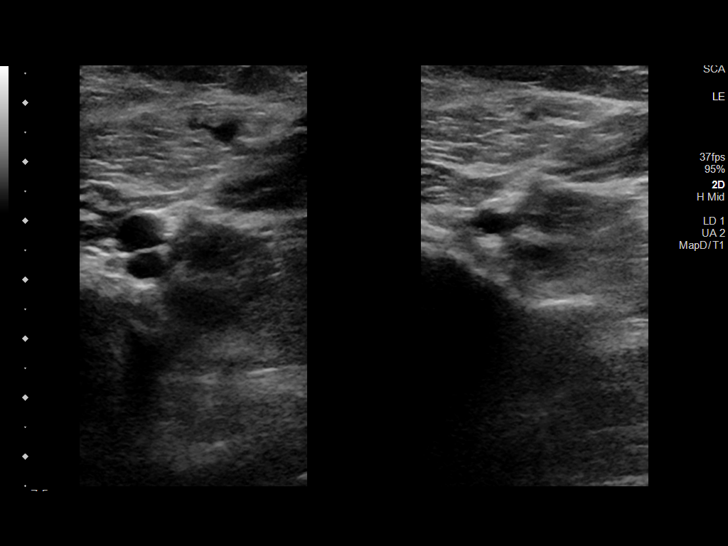
[im 35/43]
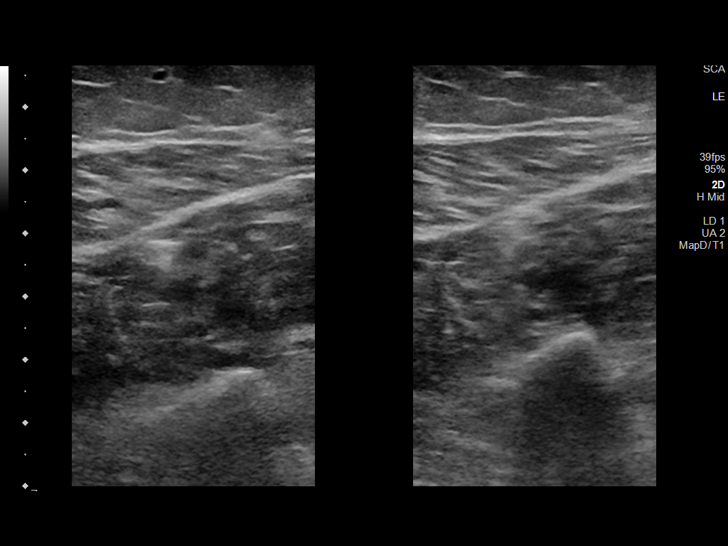
[im 39/43]
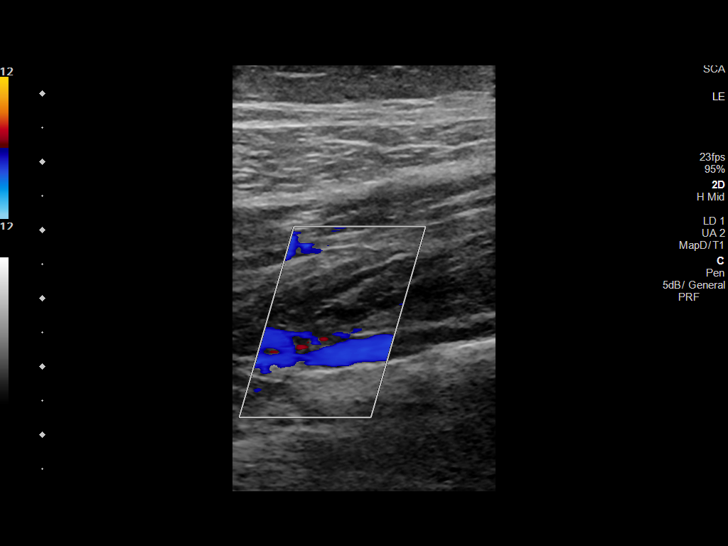
[im 43/43]
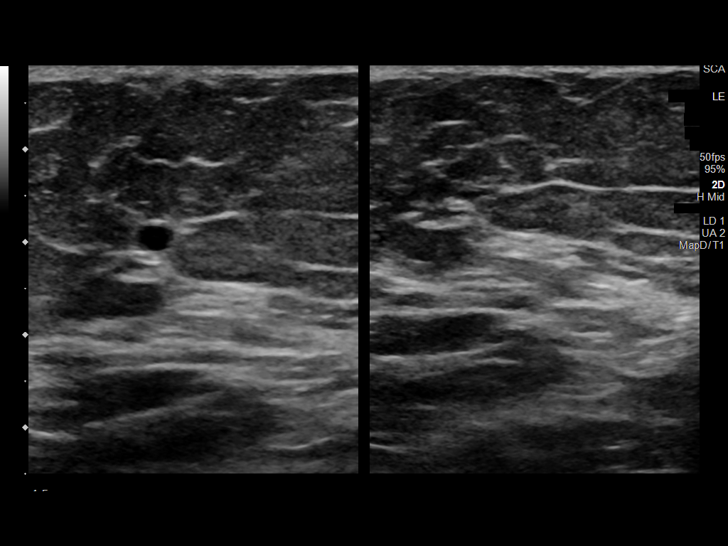

[14 of 24 positions shown; findings below may reference images not displayed]

FINDINGS: VENOUS

Normal compressibility of the common femoral, superficial femoral,
and popliteal veins, as well as the visualized calf veins.
Visualized portions of profunda femoral vein and great saphenous
vein unremarkable. No filling defects to suggest DVT on grayscale or
color Doppler imaging. Doppler waveforms show normal direction of
venous flow, normal respiratory plasticity and response to
augmentation.

Limited views of the contralateral common femoral vein are
unremarkable.

OTHER

None.

Limitations: none
IMPRESSION: Negative.

## 2021-05-13 MED ORDER — ENOXAPARIN SODIUM 100 MG/ML IJ SOSY
1.0000 mg/kg | PREFILLED_SYRINGE | Freq: Once | INTRAMUSCULAR | Status: AC
  Administered 2021-05-13: 95 mg via SUBCUTANEOUS
  Filled 2021-05-13: qty 1

## 2021-12-21 ENCOUNTER — Encounter (HOSPITAL_BASED_OUTPATIENT_CLINIC_OR_DEPARTMENT_OTHER): Payer: Self-pay | Admitting: Emergency Medicine

## 2021-12-21 ENCOUNTER — Other Ambulatory Visit: Payer: Self-pay

## 2021-12-21 DIAGNOSIS — I10 Essential (primary) hypertension: Secondary | ICD-10-CM | POA: Diagnosis not present

## 2021-12-21 DIAGNOSIS — R519 Headache, unspecified: Secondary | ICD-10-CM | POA: Diagnosis present

## 2021-12-21 DIAGNOSIS — Z79899 Other long term (current) drug therapy: Secondary | ICD-10-CM | POA: Diagnosis not present

## 2021-12-21 NOTE — ED Triage Notes (Signed)
Pt c/o LT posterior HA for 1 wk, worse this evening; sts she heard a loud bang this evening and there was nothing around her that would have made the sound; she is concerned for an aneurysm; sts feels like LT ear is "going to explode"

## 2021-12-21 NOTE — ED Notes (Addendum)
Discussed pt with EDP; they would like to evaluate her to r/o ruptured TM before ordering CT head

## 2021-12-22 ENCOUNTER — Encounter (HOSPITAL_BASED_OUTPATIENT_CLINIC_OR_DEPARTMENT_OTHER): Payer: Self-pay

## 2021-12-22 ENCOUNTER — Emergency Department (HOSPITAL_BASED_OUTPATIENT_CLINIC_OR_DEPARTMENT_OTHER)
Admission: EM | Admit: 2021-12-22 | Discharge: 2021-12-22 | Disposition: A | Discharge status: Home / Self Care | Payer: No Typology Code available for payment source | Attending: Emergency Medicine | Admitting: Emergency Medicine

## 2021-12-22 ENCOUNTER — Emergency Department (HOSPITAL_BASED_OUTPATIENT_CLINIC_OR_DEPARTMENT_OTHER): Payer: No Typology Code available for payment source

## 2021-12-22 DIAGNOSIS — R519 Headache, unspecified: Secondary | ICD-10-CM

## 2021-12-22 LAB — BASIC METABOLIC PANEL
Anion gap: 7 (ref 5–15)
BUN: 17 mg/dL (ref 6–20)
CO2: 28 mmol/L (ref 22–32)
Calcium: 9 mg/dL (ref 8.9–10.3)
Chloride: 102 mmol/L (ref 98–111)
Creatinine, Ser: 0.86 mg/dL (ref 0.44–1.00)
GFR, Estimated: >60 mL/min (ref >60)
Glucose, Bld: 93 mg/dL (ref 70–99)
Potassium: 3.6 mmol/L (ref 3.5–5.1)
Sodium: 137 mmol/L (ref 135–145)

## 2021-12-22 LAB — CBC WITH DIFFERENTIAL/PLATELET
Abs Immature Granulocytes: 0.02 10*3/uL (ref 0.00–0.07)
Basophils Absolute: 0 10*3/uL (ref 0.0–0.1)
Basophils Relative: 0 %
Eosinophils Absolute: 0.2 10*3/uL (ref 0.0–0.5)
Eosinophils Relative: 3 %
HCT: 37.5 % (ref 36.0–46.0)
Hemoglobin: 11.7 g/dL — ABNORMAL LOW (ref 12.0–15.0)
Immature Granulocytes: 0 %
Lymphocytes Relative: 23 %
Lymphs Abs: 1.9 10*3/uL (ref 0.7–4.0)
MCH: 26.2 pg (ref 26.0–34.0)
MCHC: 31.2 g/dL (ref 30.0–36.0)
MCV: 83.9 fL (ref 80.0–100.0)
Monocytes Absolute: 0.8 10*3/uL (ref 0.1–1.0)
Monocytes Relative: 10 %
Neutro Abs: 5.2 10*3/uL (ref 1.7–7.7)
Neutrophils Relative %: 64 %
Platelets: 308 10*3/uL (ref 150–400)
RBC: 4.47 MIL/uL (ref 3.87–5.11)
RDW: 15.4 % (ref 11.5–15.5)
WBC: 8.1 10*3/uL (ref 4.0–10.5)
nRBC: 0 % (ref 0.0–0.2)

## 2021-12-22 MED ORDER — IOHEXOL 350 MG/ML SOLN
75.0000 mL | Freq: Once | INTRAVENOUS | Status: AC | PRN
  Administered 2021-12-22: 75 mL via INTRAVENOUS

## 2021-12-22 NOTE — Discharge Instructions (Signed)
Take Tylenol 1000 mg rotated with ibuprofen 600 mg every 4 hours as needed for pain.  Continue other medications as previously prescribed.  Follow-up with primary doctor if not improving, and return to the ER if symptoms significantly worsen or change.

## 2021-12-22 NOTE — ED Provider Notes (Signed)
[facility] Provider Note   CSN: [id number] Arrival date & time: 12/21/21  2130     History  Chief Complaint  Patient presents with   Headache    Beverly Hobbs is a 46 y.o. female.  Patient is a 46 year old female with past medical history of hypertension, hyperlipidemia.  Patient presenting today for evaluation of headache.  She reports intermittent headaches throughout the week, then this evening heard a sudden noise in the back of her head and left ear followed by worsening headache.  She is concerned she may have had an aneurysm.  She denies to me she is having any fevers or chills.  She denies any injury or trauma.  The history is provided by the patient.       Home Medications Prior to Admission medications   Medication Sig Start Date End Date Taking? Authorizing Provider  amLODipine (NORVASC) 10 MG tablet Take 10 mg by mouth daily. 05/28/17   [provider]  atorvastatin (LIPITOR) 20 MG tablet Take 20 mg by mouth daily. 07/09/20   [provider]  cloNIDine (CATAPRES) 0.2 MG tablet Take 0.2 mg by mouth as needed.  05/26/17   [provider]  diazepam (VALIUM) 5 MG tablet Take 5 mg by mouth every 12 (twelve) hours as needed for anxiety. Patient not taking: Reported on 08/13/2020    [provider]  labetalol (NORMODYNE) 100 MG tablet Take 1 tablet (100 mg total) by mouth 2 (two) times daily. 03/18/16   Everlene Balls, MD  spironolactone (ALDACTONE) 25 MG tablet Take 25 mg by mouth 2 (two) times daily. Patient not taking: Reported on 08/13/2020    [provider]  topiramate (TOPAMAX) 50 MG tablet Take 1 tablet (50 mg total) by mouth 2 (two) times daily. 09/14/17   Penumalli, Earlean Polka, MD      Allergies    Codeine and Hydrocodone-acetaminophen    Review of Systems   Review of Systems  All other systems reviewed and are negative.   Physical Exam Updated Vital Signs BP (!) 165/80   Pulse 70    Temp 98 F (36.7 C) (Oral)   Resp 16   Ht 5\' 6"  (1.676 m)   Wt 93.9 kg   LMP 12/07/2021   SpO2 100%   BMI 33.41 kg/m  Physical Exam Vitals and nursing note reviewed.  Constitutional:      General: She is not in acute distress.    Appearance: She is well-developed. She is not diaphoretic.  HENT:     Head: Normocephalic and atraumatic.     Comments: TMs are clear bilaterally. Eyes:     Extraocular Movements: Extraocular movements intact.     Pupils: Pupils are equal, round, and reactive to light.  Cardiovascular:     Rate and Rhythm: Normal rate and regular rhythm.     Heart sounds: No murmur heard.    No friction rub. No gallop.  Pulmonary:     Effort: Pulmonary effort is normal. No respiratory distress.     Breath sounds: Normal breath sounds. No wheezing.  Abdominal:     General: Bowel sounds are normal. There is no distension.     Palpations: Abdomen is soft.     Tenderness: There is no abdominal tenderness.  Musculoskeletal:        General: Normal range of motion.     Cervical back: Normal range of motion and neck supple.  Skin:    General: Skin is warm and  dry.  Neurological:     General: No focal deficit present.     Mental Status: She is alert and oriented to person, place, and time.     Cranial Nerves: No cranial nerve deficit or facial asymmetry.     ED Results / Procedures / Treatments   Labs (all labs ordered are listed, but only abnormal results are displayed) Labs Reviewed - No data to display  EKG None  Radiology No results found.  Procedures Procedures    Medications Ordered in ED Medications - No data to display  ED Course/ Medical Decision Making/ A&P  Patient is a 46 year old female presenting with complaints of headache as described in the HPI.  She describes hearing a loud noise in the back of her head and was concerned she may have had an aneurysm rupture.  Patient arrives here neurologically intact and in no distress.  Vital signs  are stable.  Workup initiated including CBC and basic metabolic panel.  The studies were unremarkable.  A CTA of the head was also obtained and was normal showing no evidence for aneurysm or bleed.  At this point, patient appears comfortable and remains neurologically intact.  I suspect she had some sort of migraine phenomenon as nothing has turned up out of the ordinary.  Patient to be discharged with as needed return.  Final Clinical Impression(s) / ED Diagnoses Final diagnoses:  None    Rx / DC Orders ED Discharge Orders     None         Geoffery Lyons, MD 12/22/21 [phone]

## 2022-05-21 ENCOUNTER — Other Ambulatory Visit: Payer: Self-pay | Admitting: Obstetrics & Gynecology

## 2022-05-21 DIAGNOSIS — N9089 Other specified noninflammatory disorders of vulva and perineum: Secondary | ICD-10-CM

## 2022-05-22 ENCOUNTER — Other Ambulatory Visit: Payer: Self-pay | Admitting: Obstetrics & Gynecology

## 2022-05-22 DIAGNOSIS — N9089 Other specified noninflammatory disorders of vulva and perineum: Secondary | ICD-10-CM

## 2022-07-01 ENCOUNTER — Ambulatory Visit
Admission: RE | Admit: 2022-07-01 | Discharge: 2022-07-01 | Disposition: A | Discharge status: Home / Self Care | Payer: No Typology Code available for payment source | Source: Ambulatory Visit | Attending: Obstetrics & Gynecology | Admitting: Obstetrics & Gynecology

## 2022-07-01 DIAGNOSIS — N9089 Other specified noninflammatory disorders of vulva and perineum: Secondary | ICD-10-CM

## 2022-08-21 ENCOUNTER — Other Ambulatory Visit: Payer: Self-pay | Admitting: Obstetrics & Gynecology

## 2022-08-21 DIAGNOSIS — N9089 Other specified noninflammatory disorders of vulva and perineum: Secondary | ICD-10-CM

## 2022-08-22 ENCOUNTER — Ambulatory Visit
Admission: RE | Admit: 2022-08-22 | Discharge: 2022-08-22 | Disposition: A | Discharge status: Home / Self Care | Payer: Self-pay | Source: Ambulatory Visit | Attending: Obstetrics & Gynecology | Admitting: Obstetrics & Gynecology

## 2022-08-22 DIAGNOSIS — N9089 Other specified noninflammatory disorders of vulva and perineum: Secondary | ICD-10-CM
